# Patient Record
Sex: Male | Born: 1975 | Race: White | Hispanic: No | Marital: Married | Smoking: Current some day smoker
Health system: Southern US, Community
[De-identification: ages and names within clinical notes are randomized; demographics above are authoritative.]

## PROBLEM LIST (undated history)

## (undated) DIAGNOSIS — T7840XA Allergy, unspecified, initial encounter: Secondary | ICD-10-CM

## (undated) DIAGNOSIS — M109 Gout, unspecified: Secondary | ICD-10-CM

## (undated) HISTORY — DX: Allergy, unspecified, initial encounter: T78.40XA

## (undated) HISTORY — PX: COLONOSCOPY: SHX174

## (undated) HISTORY — DX: Gout, unspecified: M10.9

## (undated) HISTORY — PX: TYMPANOPLASTY: SHX33

---

## 2002-11-16 ENCOUNTER — Encounter: Payer: Self-pay | Admitting: Emergency Medicine

## 2002-11-16 ENCOUNTER — Emergency Department (HOSPITAL_COMMUNITY): Admission: EM | Admit: 2002-11-16 | Discharge: 2002-11-16 | Payer: Self-pay | Admitting: Emergency Medicine

## 2006-04-13 ENCOUNTER — Emergency Department (HOSPITAL_COMMUNITY): Admission: EM | Admit: 2006-04-13 | Discharge: 2006-04-14 | Payer: Self-pay | Admitting: Emergency Medicine

## 2008-04-03 IMAGING — CR DG CHEST 2V
2 series · 2 of 2 positions shown · non-contrast
Comparison: none

CLINICAL DATA: Chest pain

Chest 2 view:
No previous for comparison. Tiny dense nodule in the right midlung zone probably
granuloma. The lungs otherwise clear. Heart size and pulmonary vascularity
normal. No effusion. Visualized bones unremarkable.

[w chest pa]
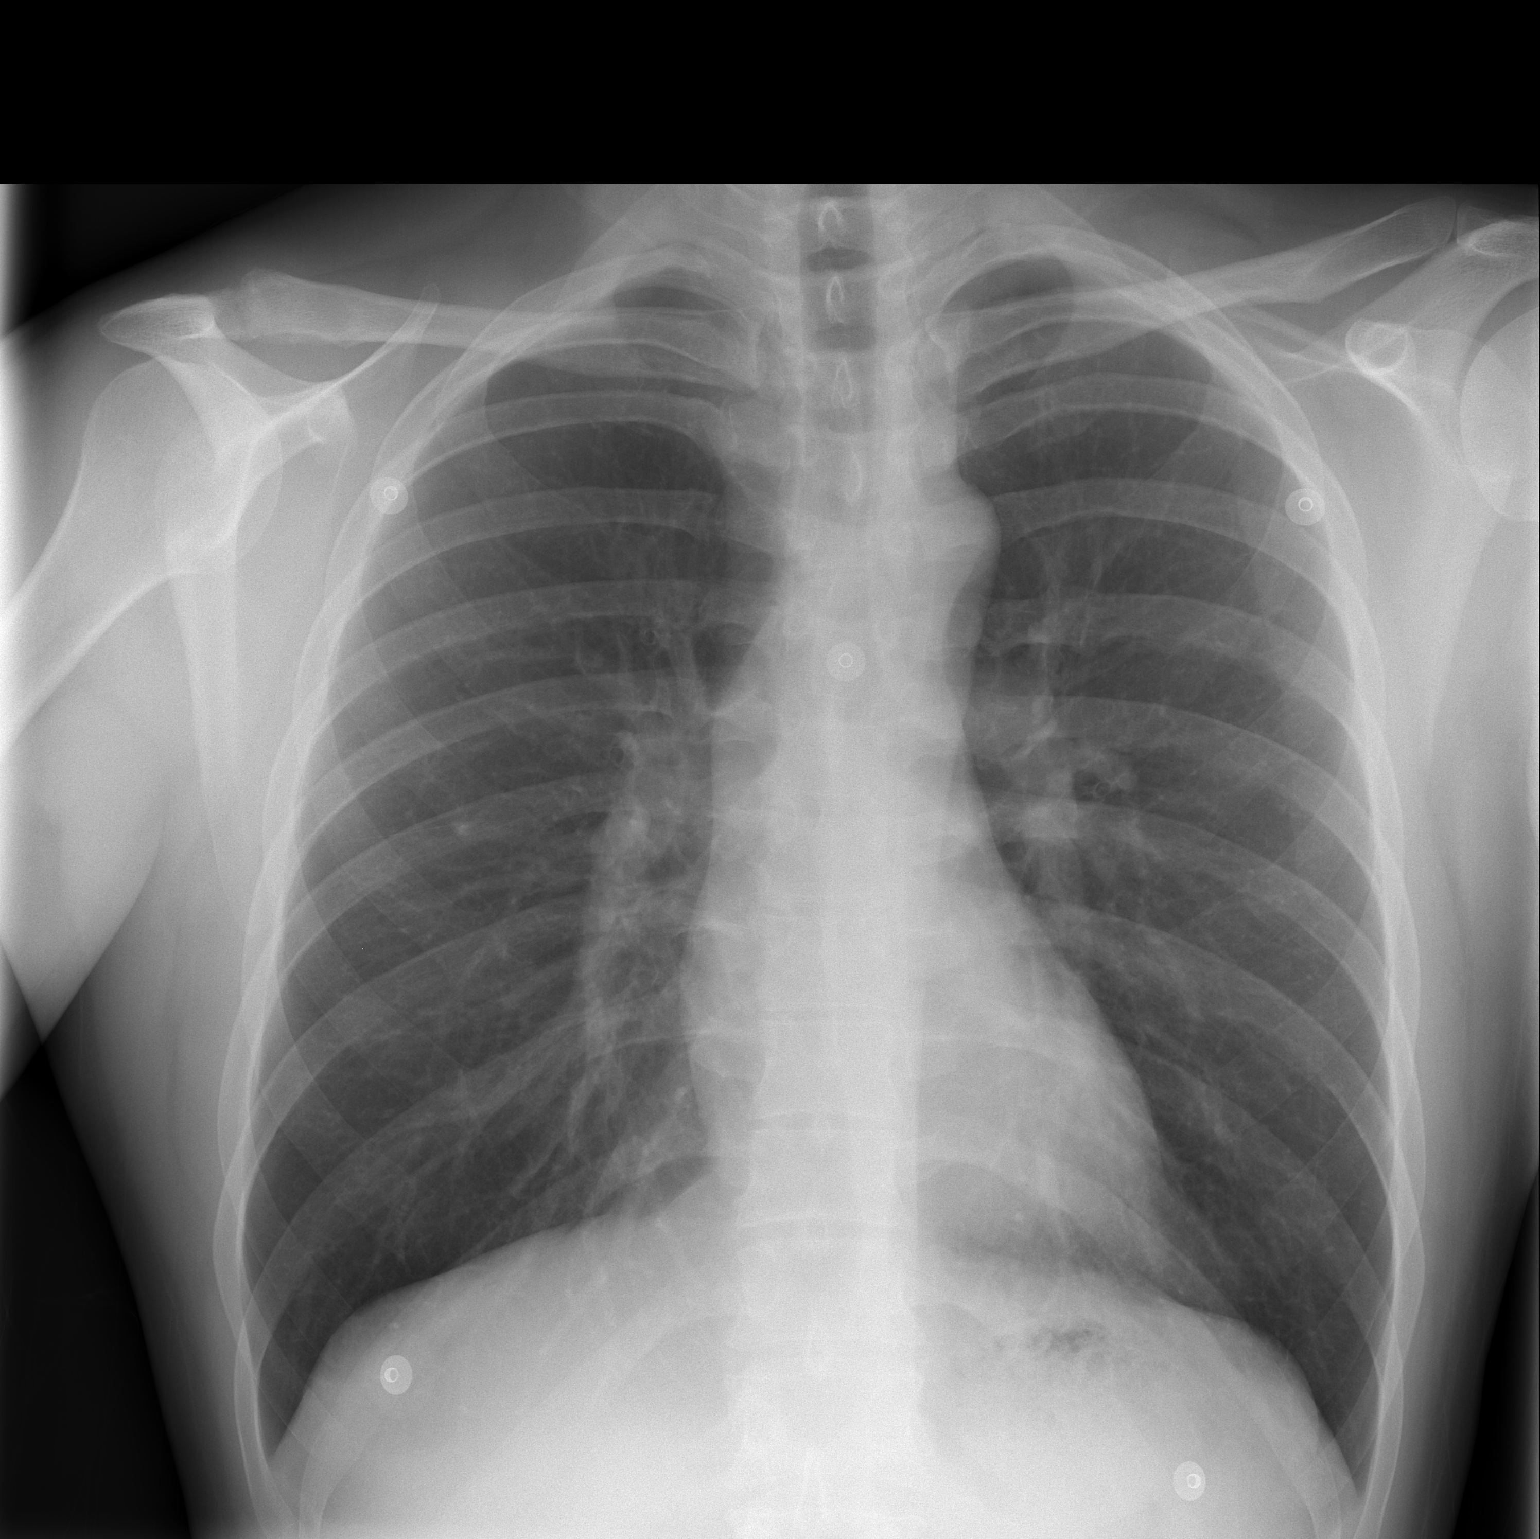

[w chest lat]
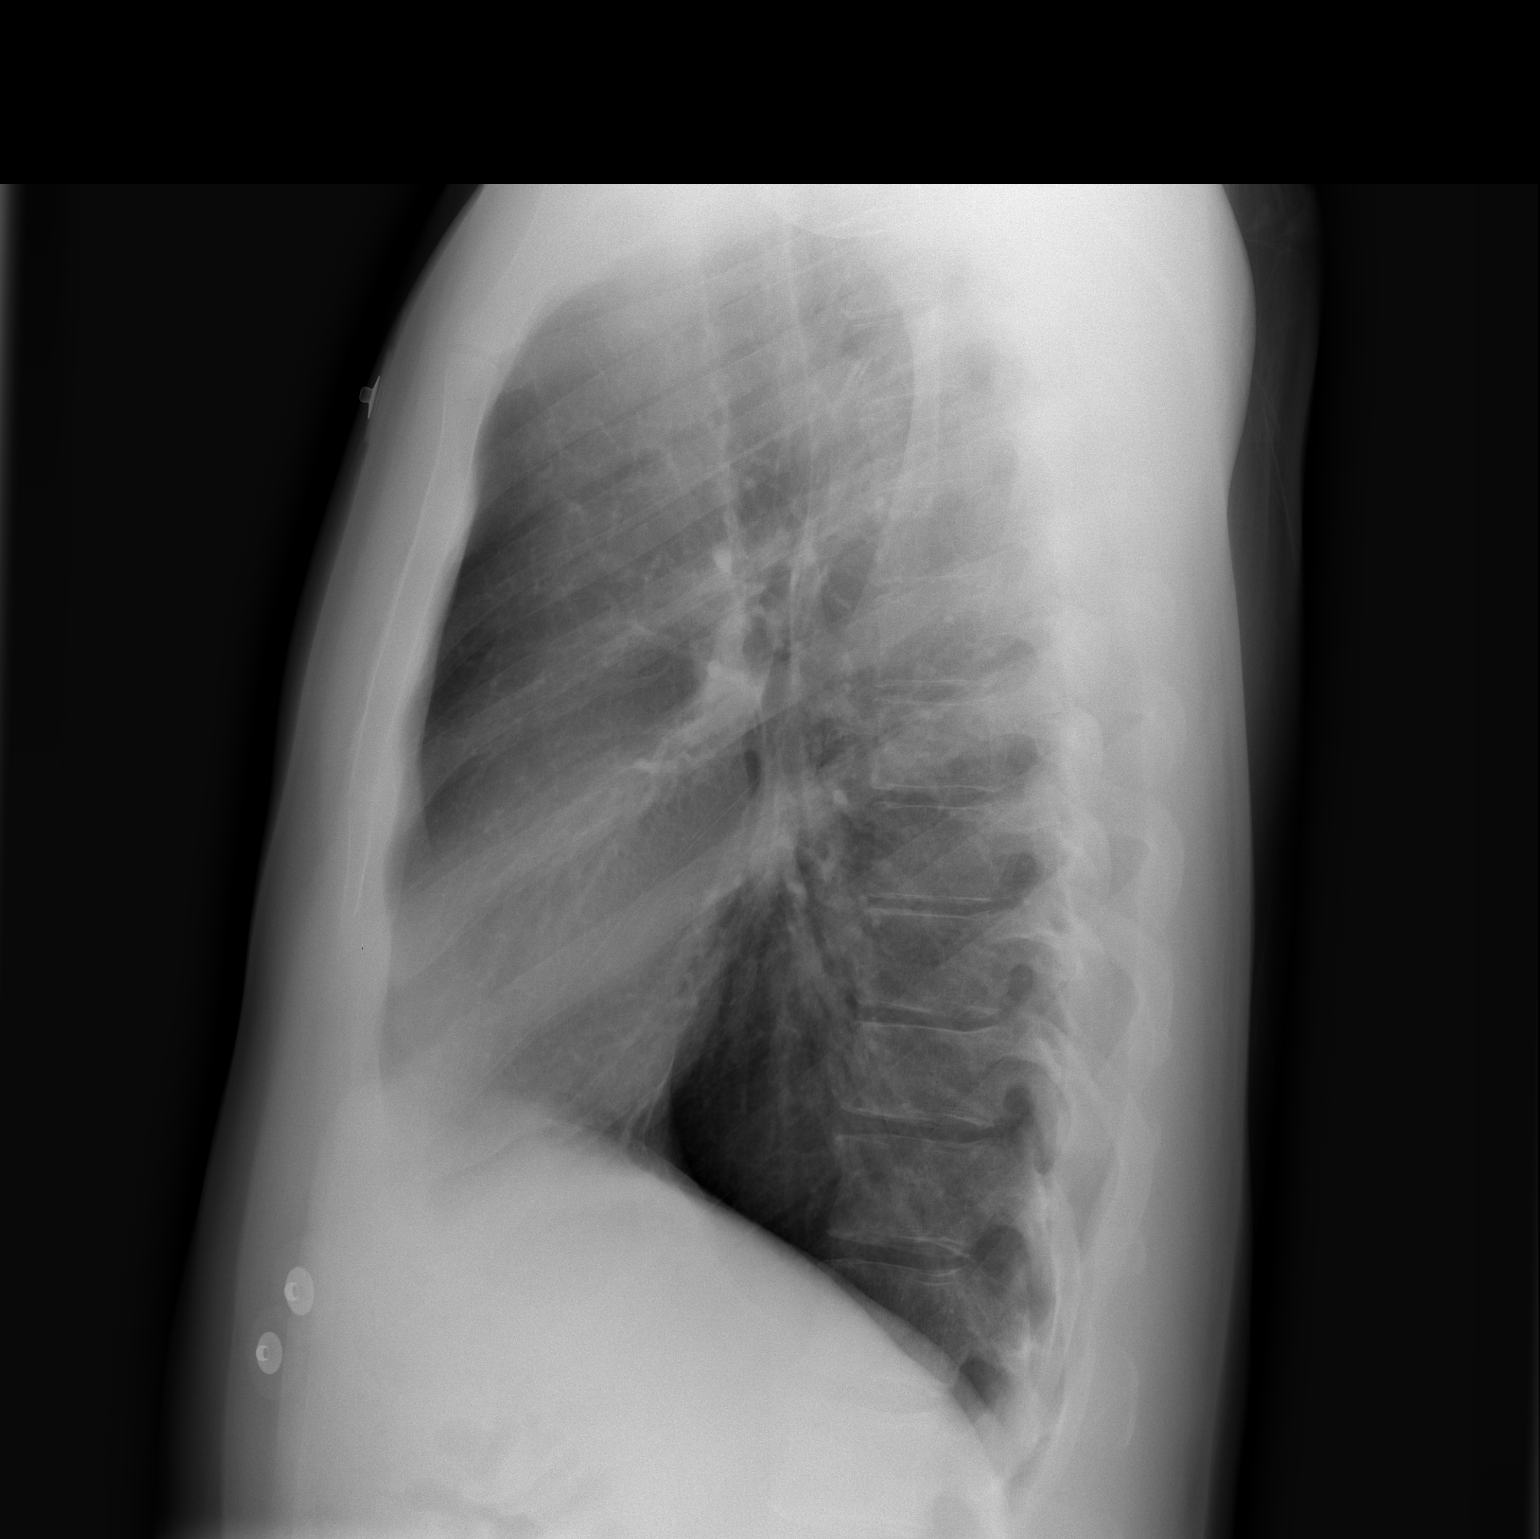

[2 of 2 positions shown; findings below may reference images not displayed]

IMPRESSION: 1. No acute disease

## 2017-10-06 ENCOUNTER — Ambulatory Visit (INDEPENDENT_AMBULATORY_CARE_PROVIDER_SITE_OTHER): Payer: BC Managed Care – PPO | Admitting: Orthopaedic Surgery

## 2017-10-06 ENCOUNTER — Ambulatory Visit (INDEPENDENT_AMBULATORY_CARE_PROVIDER_SITE_OTHER): Payer: Self-pay

## 2017-10-06 ENCOUNTER — Encounter (INDEPENDENT_AMBULATORY_CARE_PROVIDER_SITE_OTHER): Payer: Self-pay | Admitting: Orthopaedic Surgery

## 2017-10-06 VITALS — BP 137/102 | HR 82 | Resp 18 | Ht 70.0 in | Wt 215.0 lb

## 2017-10-06 DIAGNOSIS — M25562 Pain in left knee: Secondary | ICD-10-CM

## 2017-10-06 NOTE — Progress Notes (Signed)
Office Visit Note   Patient: Chad Galloway           Date of Birth: 09-09-1975           MRN: [id number] Visit Date: 10/06/2017              Requested by: No referring provider defined for this encounter. PCP: No primary care provider on file.   Assessment & Plan: Visit Diagnoses:  1. Acute pain of left knee     Plan: Several diagnostic possibilities for the left knee pain exam today is completely benign.  Might have a mild stretch of the medial collateral ligament, small tear of the medial meniscus or some early arthritis in the medial compartment not visible by plain film.  Monitor course and consider MRI scan if no improvement.  Has NSAIDs and a knee support.  Actually feeling better over the past week  Follow-Up Instructions: Return if symptoms worsen or fail to improve.   Orders:  Orders Placed This Encounter  Procedures  . XR KNEE 3 VIEW LEFT   No orders of the defined types were placed in this encounter.     Procedures: No procedures performed   Clinical Data: No additional findings.   Subjective: Chief Complaint  Patient presents with  . Left Knee - Pain  . New Patient (Initial Visit)    L MEDIAL KNEE PAIN FOR 4 WEEKS AFTER [place]. USING KNEE BRACE  42 year old gentleman experienced onset of medial left knee pain approximately a month ago while bowling.  Chad Galloway leads with his left leg as he completes the bowling motion.  The pain was somewhat insidious in onset and not particularly acute.  It has not been associated with any sensation of his knee giving way or effusion.  Over the past week he is actually been feeling somewhat better.  He wears a pullover knee support.  Difficulty with bending stooping or squatting or anterior knee pain. there is no prior history of knee injury or surgery  HPI  Review of Systems  Constitutional: Negative for fatigue and fever.  HENT: Negative for ear pain.   Eyes: Negative for pain.    Respiratory: Negative for cough and shortness of breath.   Cardiovascular: Negative for leg swelling.  Gastrointestinal: Negative for constipation and diarrhea.  Genitourinary: Negative for difficulty urinating.  Musculoskeletal: Positive for neck pain. Negative for back pain.  Skin: Negative for rash.  Neurological: Positive for weakness. Negative for numbness.  Hematological: Does not bruise/bleed easily.  Psychiatric/Behavioral: Negative for sleep disturbance.     Objective: Vital Signs: BP (!) 137/102 (BP Location: Left Arm, Patient Position: Sitting, Cuff Size: Normal)   Pulse 82   Resp 18   Ht 5\' 10"  (1.778 m)   Wt 215 lb (97.5 kg)   BMI 30.85 kg/m   Physical Exam  Constitutional: He is oriented to person, place, and time. He appears well-developed and well-nourished.  HENT:  Mouth/Throat: Oropharynx is clear and moist.  Eyes: Pupils are equal, round, and reactive to light. EOM are normal.  Pulmonary/Chest: Effort normal.  Neurological: He is alert and oriented to person, place, and time.  Skin: Skin is warm and dry.  Psychiatric: He has a normal mood and affect. His behavior is normal.    Ortho Exam awake alert and oriented x3.  Comfortable sitting.  Left knee exam without effusion.  No localized tenderness along the entire medial compartment.  No opening with varus valgus stress.  Mild patellar  crepitation but a totally asymptomatic.  No popliteal pain or mass.  No calf pain.  No distal edema.  Straight leg raise negative.  Painless range of motion left hip . skin intact.  Negative anterior drawer sign  Specialty Comments:  No specialty comments available.  Imaging: Xr Knee 3 View Left  Result Date: 10/06/2017 Films of the left knee were obtained in 3 projections standing.  There is no ectopic calcification.  No evidence of acute injury.  No evidence of osteoarthritis.  Patella tracks in the midline.  Films are benign    PMFS History: There are no active  problems to display for this patient.  History reviewed. No pertinent past medical history.  History reviewed. No pertinent family history.  Past Surgical History:  Procedure Laterality Date  . TYMPANOPLASTY     Social History   Occupational History  . Not on file  Tobacco Use  . Smoking status: Current Some Day Smoker    Types: Cigars  . Smokeless tobacco: Current User    Types: Chew  Substance and Sexual Activity  . Alcohol use: Yes  . Drug use: Not Currently  . Sexual activity: Not on file

## 2019-03-16 ENCOUNTER — Other Ambulatory Visit: Payer: Self-pay

## 2019-03-16 DIAGNOSIS — Z20822 Contact with and (suspected) exposure to covid-19: Secondary | ICD-10-CM

## 2019-03-18 LAB — NOVEL CORONAVIRUS, NAA: SARS-CoV-2, NAA: NOT DETECTED

## 2019-06-07 ENCOUNTER — Ambulatory Visit: Payer: BC Managed Care – PPO | Admitting: Family Medicine

## 2019-06-07 ENCOUNTER — Encounter: Payer: Self-pay | Admitting: Family Medicine

## 2019-06-07 ENCOUNTER — Other Ambulatory Visit: Payer: Self-pay

## 2019-06-07 VITALS — BP 132/84 | HR 94 | Temp 97.2°F | Ht 71.0 in | Wt 221.4 lb

## 2019-06-07 DIAGNOSIS — M1A0790 Idiopathic chronic gout, unspecified ankle and foot, without tophus (tophi): Secondary | ICD-10-CM | POA: Diagnosis not present

## 2019-06-07 DIAGNOSIS — H6982 Other specified disorders of Eustachian tube, left ear: Secondary | ICD-10-CM | POA: Diagnosis not present

## 2019-06-07 MED ORDER — ALLOPURINOL 100 MG PO TABS: 100.0000 mg | ORAL_TABLET | Freq: Every day | ORAL | 6 refills | Status: DC

## 2019-06-07 MED ORDER — PREDNISONE 5 MG (48) PO TBPK: ORAL_TABLET | ORAL | 0 refills | Status: DC

## 2019-06-07 NOTE — Patient Instructions (Addendum)
Give [place] 2-3 business days to get the results of your labs back.   Don't start allopurinol until your flare has passed.  Foods to AVOID: Red meat, organ meat (liver), lunch meat, seafood (mussels, scallops, anchovies, etc) Alcohol Sugary foods/beverages (diet soft drinks have no link to flares)  Foods to migrate to: Dairy Vegetables Cherries have limited data to suggest they help lower uric acid levels (and prevent flares) Vit C (500 mg daily) may have a modest effect with preventing flares Poultry If you are going to eat red meat, beef and pork may give you less problems than lamb.  For your ear, see how the prednisone does. If it doesn't do anything, let me know and we will set you up with the ENT team. If it works well, start using Flonase to help it.   Let us know if you need anything.

## 2019-06-07 NOTE — Progress Notes (Signed)
Chief Complaint  Patient presents with  . New Patient (Initial Visit)  . Gout  . Ear Pain    left       New Patient Visit SUBJECTIVE: HPI: Chad Galloway is an 44 y.o.male who is being seen for establishing care.  The patient as not previously had PCP.  3 yrs ago started having issues w gout.  It will affect both of his ankles, seemingly alternating.  There seems to be no dietary correlations.  Both his sister and father had issues with gout and ended up taking allopurinol.  He currently has a flare in his right ankle medially.  There is some swelling and redness.  Prednisone has been prescribed in the past that works well for his pain.  Patient has a several decade history of left ear fullness.  He recently had pain and was prescribed 2 rounds of antibiotics.  While there is no longer any pain, he is having fullness still.  Sometimes congestion precludes the pain.  He is not having any fevers or drainage from his ears.  He has never seen the ENT team.  Past Medical History:  Diagnosis Date  . Gout    Past Surgical History:  Procedure Laterality Date  . TYMPANOPLASTY     Family History  Problem Relation Age of Onset  . Gout Father   . Gout Sister    No Known Allergies  Current Outpatient Medications:  .  [START ON 06/20/2019] allopurinol (ZYLOPRIM) 100 MG tablet, Take 1 tablet (100 mg total) by mouth daily., Disp: 30 tablet, Rfl: 6 .  predniSONE (STERAPRED UNI-PAK 48 TAB) 5 MG (48) TBPK tablet, Follow instructions on package., Disp: 42 tablet, Rfl: 0  ROS Cardiovascular: Denies chest pain  Respiratory: Denies dyspnea   OBJECTIVE: BP 132/84 (BP Location: Left Arm, Patient Position: Sitting, Cuff Size: Large)   Pulse 94   Temp (!) 97.2 F (36.2 C) (Temporal)   Ht 5\' 11"  (1.803 m)   Wt 221 lb 6 oz (100.4 kg)   SpO2 98%   BMI 30.88 kg/m  General:  well developed, well nourished, in no apparent distress Skin:  no significant moles, warts, or growths; slight right  medial erythema and warmth with swelling; there is mild tenderness to palpation HEENT: ears neg b/l Lungs:  clear to auscultation, breath sounds equal bilaterally, no respiratory distress Cardio:  regular rate and rhythm, no LE edema or bruits Neuro: No cerebellar signs Psych: well oriented with normal range of affect and appropriate judgment/insight  ASSESSMENT/PLAN: Chronic gout of ankle, unspecified cause, unspecified laterality - Plan: Basic Metabolic Panel (BMET), Uric acid, predniSONE (STERAPRED UNI-PAK 48 TAB) 5 MG (48) TBPK tablet, allopurinol (ZYLOPRIM) 100 MG tablet  Dysfunction of left eustachian tube  1-check uric acid, given his family history and story, he will likely need to start allopurinol.  I will call this in for after his flare resolves.  Prednisone for that.  Gout diet provided. 2-this sounds like eustachian tube dysfunction.  We will see how he does on the prednisone.  If it does not affect him at all, will refer him to the ENT team.  If it gives rise for improvement, will recommend an INCS. Patient should return in 7 weeks for CPE or prn. The patient voiced understanding and agreement to the plan.   [place], DO 06/07/19  11:46 AM

## 2019-07-25 NOTE — Patient Instructions (Addendum)
It was nice to meet you today, I will be in touch with your labs as soon as possible Will adjust allopurinol if needed to lower your uric acid level Please work on gradually increasing your exercise routine (contact me if any chest pain or abnormal shortness of breath) and work on losing a few lbs over time We will set you up to see GI for a colonoscopy  Try ear drops for your left ear symptoms- let me know if this does not clear things up

## 2019-07-25 NOTE — Progress Notes (Addendum)
[facility] [address], [place], [place] [address] [phone]  Date:  07/27/2019   Name:  ARMEND BAUMHARDT   DOB:  [birth year]   MRN:  [id number]  PCP:  Darreld Mclean, MD    Chief Complaint: Annual Exam and Gout   History of Present Illness:  Chad Galloway is a 44 y.o. very pleasant male patient who presents with the following:  "Shanon Brow" is here today for short-term follow-up visit Listed as my primary care patient, though I have not actually seen him in the past He did see my partner Dr. Nani Ravens in January for concern of gout in both ankles He has about a 3 year history of gout.  It can occur in either ankle   Dr. Nani Ravens treated him for acute gout with prednisone, and had him start on allopurinol He does feel like the allopurinol is helping, although the right ankle can occasionally be painful if he holds it in a certain position  Colon cancer screening; done at age 50 due to bleeding, turned out he had a polyp. He needs to have a follow-up now due to age Flu shot up-to-date Tetanus vaccine is due Needs routine labs  He does check his BP at home on occasion; we checked his home blood pressure machine today, it seems to be inaccurate.  He will get a new one He used to smoke but quit 20-25 years ago He occasionally has a cigar His parents both have HTN  He is not doing a lot of exercise right now but he plans to work on this   BP Readings from Last 3 Encounters:  07/27/19 128/82  06/07/19 132/84  10/06/17 (!) 137/102   Married to [employer], they have 2 sons ages 61 and 29 His oldest has autism but is fairly high functioning. He is doing well at a special school, and his youngest is at [employer].    Pt's mother is a retired Pharmacist, hospital and has helped out during the pandemic Pt is an Optometrist   No family history of prostate cancer  Patient notes a lot of problems with his left ear as a child, he had  tubes at least once.  More recently he notes that the ear feels sometimes tender and uncomfortable, he seems to have foul-smelling earwax from the left ear  Patient Active Problem List   Diagnosis Date Noted  . 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 Gout     Past Surgical History:  Procedure Laterality Date  . TYMPANOPLASTY      Social History   Tobacco Use  . Smoking status: Current Some Day Smoker    Types: Cigars  . Smokeless tobacco: Current User    Types: Chew  Substance Use Topics  . Alcohol use: Yes  . Drug use: Not Currently    Family History  Problem Relation Age of Onset  . Gout Father   . Gout Sister     No Known Allergies  Medication list has been reviewed and updated.  Current Outpatient Medications on File Prior to Visit  Medication Sig Dispense Refill  . allopurinol (ZYLOPRIM) 100 MG tablet Take 1 tablet (100 mg total) by mouth daily. 30 tablet 6  . augmented betamethasone dipropionate (DIPROLENE-AF) 0.05 % ointment Apply 1 application topically 2 (two) times daily as needed.    Marland Kitchen ketoconazole (NIZORAL) 2 % shampoo  Apply 1 application topically 3 (three) times a week.    . mometasone (ELOCON) 0.1 % lotion Apply 1 application topically daily as needed.     No current facility-administered medications on file prior to visit.    Review of Systems:  As per HPI- otherwise negative.   Physical Examination: Vitals:   07/27/19 1020  BP: 128/82  Pulse: 92  Resp: 16  Temp: (!) 96.6 F (35.9 C)  SpO2: 98%   Vitals:   07/27/19 1020  Weight: 219 lb (99.3 kg)  Height: 5\' 11"  (1.803 m)   Body mass index is 30.54 kg/m. Ideal Body Weight: Weight in (lb) to have BMI = 25: 178.9  GEN: no acute distress.  Overweight, otherwise looks well HEENT: Atraumatic, Normocephalic.   Bilateral TM wnl, oropharynx normal.  PEERL,EOMI. the left external ear canal is somewhat damp, there is some debris adherent to the walls  of the canal Ears and Nose: No external deformity. CV: RRR, No M/G/R. No JVD. No thrill. No extra heart sounds. PULM: CTA B, no wheezes, crackles, rhonchi. No retractions. No resp. distress. No accessory muscle use. ABD: S, NT, ND, +BS. No rebound. No HSM. EXTR: No c/c/e PSYCH: Normally interactive. Conversant.    Assessment and Plan: Chronic gout of ankle, unspecified cause, unspecified laterality - Plan: CBC, Comprehensive metabolic panel, Uric acid  Screening for deficiency anemia - Plan: CBC  Screening for diabetes mellitus - Plan: Comprehensive metabolic panel, Hemoglobin A1c  Screening for hyperlipidemia - Plan: Lipid panel  Immunization due - Plan: Tdap vaccine greater than or equal to 7yo IM  Other infective acute otitis externa of left ear - Plan: ofloxacin (FLOXIN OTIC) 0.3 % OTIC solution  Screening for colon cancer - Plan: Ambulatory referral to Gastroenterology  Routine labs pending as above Refer to GI for colonoscopy Updated tetanus Will try 7 to 10 days of Floxin Otic for his left ear, he will let me know if this is not helpful-in that case plan referral to ENT Moderate medical decision making today This visit occurred during the SARS-CoV-2 public health emergency.  Safety protocols were in place, including screening questions prior to the visit, additional usage of staff PPE, and extensive cleaning of exam room while observing appropriate contact time as indicated for disinfecting solutions.    Signed Lamar Blinks, MD  Received his labs as below, message to patient  Results for orders placed or performed in visit on 07/27/19  CBC  Result Value Ref Range   WBC 5.6 4.0 - 10.5 K/uL   RBC 4.76 4.22 - 5.81 Mil/uL   Platelets 237.0 150.0 - 400.0 K/uL   Hemoglobin 15.3 13.0 - 17.0 g/dL   HCT 44.9 39.0 - 52.0 %   MCV 94.5 78.0 - 100.0 fl   MCHC 34.1 30.0 - 36.0 g/dL   RDW 13.0 11.5 - 15.5 %  Comprehensive metabolic panel  Result Value Ref Range   Sodium  140 135 - 145 mEq/L   Potassium 3.9 3.5 - 5.1 mEq/L   Chloride 104 96 - 112 mEq/L   CO2 29 19 - 32 mEq/L   Glucose, Bld 97 70 - 99 mg/dL   BUN 13 6 - 23 mg/dL   Creatinine, Ser 1.06 0.40 - 1.50 mg/dL   Total Bilirubin 0.7 0.2 - 1.2 mg/dL   Alkaline Phosphatase 76 39 - 117 U/L   AST 18 0 - 37 U/L   ALT 26 0 - 53 U/L   Total Protein 6.8 6.0 - 8.3  g/dL   Albumin 4.4 3.5 - 5.2 g/dL   GFR 75.31 >60.00 mL/min   Calcium 9.5 8.4 - 10.5 mg/dL  Hemoglobin A1c  Result Value Ref Range   Hgb A1c MFr Bld 4.7 4.6 - 6.5 %  Lipid panel  Result Value Ref Range   Cholesterol 207 (H) 0 - 200 mg/dL   Triglycerides 160.0 (H) 0.0 - 149.0 mg/dL   HDL 43.00 >39.00 mg/dL   VLDL 32.0 0.0 - 40.0 mg/dL   LDL Cholesterol 132 (H) 0 - 99 mg/dL   Total CHOL/HDL Ratio 5    NonHDL 164.09   Uric acid  Result Value Ref Range   Uric Acid, Serum 7.3 4.0 - 7.8 mg/dL

## 2019-07-26 ENCOUNTER — Other Ambulatory Visit: Payer: Self-pay

## 2019-07-27 ENCOUNTER — Encounter: Payer: Self-pay | Admitting: Family Medicine

## 2019-07-27 ENCOUNTER — Other Ambulatory Visit: Payer: Self-pay

## 2019-07-27 ENCOUNTER — Ambulatory Visit (INDEPENDENT_AMBULATORY_CARE_PROVIDER_SITE_OTHER): Payer: BC Managed Care – PPO | Admitting: Family Medicine

## 2019-07-27 VITALS — BP 128/82 | HR 92 | Temp 96.6°F | Resp 16 | Ht 71.0 in | Wt 219.0 lb

## 2019-07-27 DIAGNOSIS — Z13220 Encounter for screening for lipoid disorders: Secondary | ICD-10-CM

## 2019-07-27 DIAGNOSIS — Z1211 Encounter for screening for malignant neoplasm of colon: Secondary | ICD-10-CM

## 2019-07-27 DIAGNOSIS — Z23 Encounter for immunization: Secondary | ICD-10-CM

## 2019-07-27 DIAGNOSIS — Z130 Encounter for screening for diseases of the blood and blood-forming organs and certain disorders involving the immune mechanism: Secondary | ICD-10-CM | POA: Diagnosis not present

## 2019-07-27 DIAGNOSIS — M1A0790 Idiopathic chronic gout, unspecified ankle and foot, without tophus (tophi): Secondary | ICD-10-CM | POA: Diagnosis not present

## 2019-07-27 DIAGNOSIS — Z131 Encounter for screening for diabetes mellitus: Secondary | ICD-10-CM

## 2019-07-27 DIAGNOSIS — H60392 Other infective otitis externa, left ear: Secondary | ICD-10-CM

## 2019-07-27 LAB — COMPREHENSIVE METABOLIC PANEL
ALT: 26 U/L (ref 0–53)
AST: 18 U/L (ref 0–37)
Albumin: 4.4 g/dL (ref 3.5–5.2)
Alkaline Phosphatase: 76 U/L (ref 39–117)
BUN: 13 mg/dL (ref 6–23)
CO2: 29 mEq/L (ref 19–32)
Calcium: 9.5 mg/dL (ref 8.4–10.5)
Chloride: 104 mEq/L (ref 96–112)
Creatinine, Ser: 1.06 mg/dL (ref 0.40–1.50)
GFR: 75.31 mL/min (ref >60.00)
Glucose, Bld: 97 mg/dL (ref 70–99)
Potassium: 3.9 mEq/L (ref 3.5–5.1)
Sodium: 140 mEq/L (ref 135–145)
Total Bilirubin: 0.7 mg/dL (ref 0.2–1.2)
Total Protein: 6.8 g/dL (ref 6.0–8.3)

## 2019-07-27 LAB — CBC
HCT: 44.9 % (ref 39.0–52.0)
Hemoglobin: 15.3 g/dL (ref 13.0–17.0)
MCHC: 34.1 g/dL (ref 30.0–36.0)
MCV: 94.5 fl (ref 78.0–100.0)
Platelets: 237 10*3/uL (ref 150.0–400.0)
RBC: 4.76 Mil/uL (ref 4.22–5.81)
RDW: 13 % (ref 11.5–15.5)
WBC: 5.6 10*3/uL (ref 4.0–10.5)

## 2019-07-27 LAB — HEMOGLOBIN A1C: Hgb A1c MFr Bld: 4.7 % (ref 4.6–6.5)

## 2019-07-27 LAB — LIPID PANEL
Cholesterol: 207 mg/dL — ABNORMAL HIGH (ref 0–200)
HDL: 43 mg/dL (ref >39.00)
LDL Cholesterol: 132 mg/dL — ABNORMAL HIGH (ref 0–99)
NonHDL: 164.09
Total CHOL/HDL Ratio: 5
Triglycerides: 160 mg/dL — ABNORMAL HIGH (ref 0.0–149.0)
VLDL: 32 mg/dL (ref 0.0–40.0)

## 2019-07-27 LAB — URIC ACID: Uric Acid, Serum: 7.3 mg/dL (ref 4.0–7.8)

## 2019-07-27 MED ORDER — OFLOXACIN 0.3 % OT SOLN: 10.0000 [drp] | Freq: Every day | OTIC | 0 refills | Status: DC

## 2019-07-27 MED ORDER — ALLOPURINOL 100 MG PO TABS: 200.0000 mg | ORAL_TABLET | Freq: Every day | ORAL | 3 refills | Status: DC

## 2019-07-27 NOTE — Addendum Note (Signed)
Addended by: Lamar Blinks C on: 07/27/2019 06:16 PM   Modules accepted: Orders

## 2019-08-04 ENCOUNTER — Encounter: Payer: Self-pay | Admitting: Gastroenterology

## 2019-08-04 ENCOUNTER — Encounter: Payer: Self-pay | Admitting: Family Medicine

## 2019-08-18 ENCOUNTER — Ambulatory Visit: Payer: BC Managed Care – PPO | Attending: Internal Medicine

## 2019-08-18 DIAGNOSIS — Z23 Encounter for immunization: Secondary | ICD-10-CM

## 2019-08-18 NOTE — Progress Notes (Signed)
   Covid-19 Vaccination Clinic  Name:  Chad Galloway    MRN: [id number] DOB: 07-12-1975  08/18/2019  Mr. Chad Galloway was observed post Covid-19 immunization for 15 minutes without incident. He was provided with Vaccine Information Sheet and instruction to access the V-Safe system.   Mr. Chad Galloway was instructed to call 911 with any severe reactions post vaccine: Marland Kitchen Difficulty breathing  . Swelling of face and throat  . A fast heartbeat  . A bad rash all over body  . Dizziness and weakness   Immunizations Administered    Name Date Dose VIS Date Route   Pfizer COVID-19 Vaccine 08/18/2019 10:50 AM 0.3 mL 05/06/2019 Intramuscular   Manufacturer: [place]   Lot: [id number]   [place]: [id number]

## 2019-09-09 ENCOUNTER — Other Ambulatory Visit: Payer: Self-pay

## 2019-09-09 ENCOUNTER — Ambulatory Visit (AMBULATORY_SURGERY_CENTER): Payer: Self-pay | Admitting: *Deleted

## 2019-09-09 VITALS — Temp 97.1°F | Ht 71.0 in | Wt 220.0 lb

## 2019-09-09 DIAGNOSIS — Z1211 Encounter for screening for malignant neoplasm of colon: Secondary | ICD-10-CM

## 2019-09-09 NOTE — Progress Notes (Signed)
Patient is here in-person for PV. Patient denies any allergies to eggs or soy. Patient denies any problems with anesthesia/sedation. Patient denies any oxygen use at home. Patient denies taking any diet/weight loss medications or blood thinners. Patient is not being treated for MRSA or C-diff. EMMI education assisgned to the patient for the procedure, this was explained and instructions given to patient. Patient is aware of our care-partner policy and 0000000 safety protocol.   covid vaccines completed per pt.

## 2019-09-12 ENCOUNTER — Ambulatory Visit: Payer: BC Managed Care – PPO | Attending: Internal Medicine

## 2019-09-12 DIAGNOSIS — Z23 Encounter for immunization: Secondary | ICD-10-CM

## 2019-09-12 NOTE — Progress Notes (Signed)
   Covid-19 Vaccination Clinic  Name:  Chad Galloway    MRN: [id number] DOB: 04-05-1976  09/12/2019  Mr. [name] was observed post Covid-19 immunization for 15 minutes without incident. He was provided with Vaccine Information Sheet and instruction to access the V-Safe system.   Mr. [name] was instructed to call 911 with any severe reactions post vaccine: Marland Kitchen Difficulty breathing  . Swelling of face and throat  . A fast heartbeat  . A bad rash all over body  . Dizziness and weakness   Immunizations Administered    Name Date Dose VIS Date Route   Pfizer COVID-19 Vaccine 09/12/2019 10:50 AM 0.3 mL 07/20/2018 Intramuscular   Manufacturer: [place]   Lot: [id number]   [place]: [id number]

## 2019-09-21 ENCOUNTER — Encounter: Payer: Self-pay | Admitting: Gastroenterology

## 2019-09-23 ENCOUNTER — Ambulatory Visit (AMBULATORY_SURGERY_CENTER): Payer: BC Managed Care – PPO | Admitting: Gastroenterology

## 2019-09-23 ENCOUNTER — Encounter: Payer: Self-pay | Admitting: Gastroenterology

## 2019-09-23 ENCOUNTER — Other Ambulatory Visit: Payer: Self-pay

## 2019-09-23 VITALS — BP 128/94 | HR 74 | Temp 96.9°F | Resp 12 | Ht 71.0 in | Wt 220.0 lb

## 2019-09-23 DIAGNOSIS — D128 Benign neoplasm of rectum: Secondary | ICD-10-CM

## 2019-09-23 DIAGNOSIS — D124 Benign neoplasm of descending colon: Secondary | ICD-10-CM

## 2019-09-23 DIAGNOSIS — Z1211 Encounter for screening for malignant neoplasm of colon: Secondary | ICD-10-CM | POA: Diagnosis present

## 2019-09-23 MED ORDER — SODIUM CHLORIDE 0.9 % IV SOLN: 500.0000 mL | Freq: Once | INTRAVENOUS | Status: DC

## 2019-09-23 NOTE — Op Note (Signed)
[facility] Patient Name: Chad Galloway Procedure Date: 09/23/2019 8:36 AM MRN: [id number] Endoscopist: Mallie Mussel L. Loletha Carrow , MD Age: 44 Referring MD:  Date of Birth: 18-Jun-1975 Gender: Male Account #: [id number] Procedure:                Colonoscopy Indications:              Screening for colorectal malignant neoplasm                            (patient reports colonoscopy 15-18 years ago for                            rectal bleeding) Medicines:                Monitored Anesthesia Care Procedure:                Pre-Anesthesia Assessment:                           - Prior to the procedure, a History and Physical                            was performed, and patient medications and                            allergies were reviewed. The patient's tolerance of                            previous anesthesia was also reviewed. The risks                            and benefits of the procedure and the sedation                            options and risks were discussed with the patient.                            All questions were answered, and informed consent                            was obtained. Prior Anticoagulants: The patient has                            taken no previous anticoagulant or antiplatelet                            agents. ASA Grade Assessment: II - A patient with                            mild systemic disease. After reviewing the risks                            and benefits, the patient was deemed in  satisfactory condition to undergo the procedure.                           After obtaining informed consent, the colonoscope                            was passed under direct vision. Throughout the                            procedure, the patient's blood pressure, pulse, and                            oxygen saturations were monitored continuously. The                            Colonoscope was introduced through the anus and                            advanced to the the terminal ileum, with                            identification of the appendiceal orifice and IC                            valve. The colonoscopy was performed without                            difficulty. The patient tolerated the procedure                            well. The quality of the bowel preparation was                            good. The ileocecal valve, appendiceal orifice, and                            rectum were photographed. The bowel preparation                            used was Miralax. Scope In: 8:41:35 AM Scope Out: 8:57:18 AM Scope Withdrawal Time: 0 hours 14 minutes 26 seconds  Total Procedure Duration: 0 hours 15 minutes 43 seconds  Findings:                 The perianal and digital rectal examinations were                            normal.                           The terminal ileum appeared normal.                           A diminutive polyp was found in the descending  colon. The polyp was sessile. The polyp was removed                            with a cold snare. Resection and retrieval were                            complete.                           A 5 mm polyp was found in the rectum. The polyp was                            semi-pedunculated. The polyp was removed with a hot                            snare. Resection and retrieval were complete.                           The exam was otherwise without abnormality on                            direct and retroflexion views. Complications:            No immediate complications. Estimated Blood Loss:     Estimated blood loss was minimal. Impression:               - The examined portion of the ileum was normal.                           - One diminutive polyp in the descending colon,                            removed with a cold snare. Resected and retrieved.                           - One 5 mm polyp in the rectum, removed  with a hot                            snare. Resected and retrieved.                           - The examination was otherwise normal on direct                            and retroflexion views. Recommendation:           - Patient has a contact number available for                            emergencies. The signs and symptoms of potential                            delayed complications were discussed with the  patient. Return to normal activities tomorrow.                            Written discharge instructions were provided to the                            patient.                           - Resume previous diet.                           - Continue present medications.                           - Await pathology results.                           - Repeat colonoscopy is recommended for                            surveillance. The colonoscopy date will be                            determined after pathology results from today's                            exam become available for review. Gamaliel Charney L. Loletha Carrow, MD 09/23/2019 9:02:47 AM This report has been signed electronically.

## 2019-09-23 NOTE — Progress Notes (Signed)
Called to room to assist during endoscopic procedure.  Patient ID and intended procedure confirmed with present staff. Received instructions for my participation in the procedure from the performing physician.  

## 2019-09-23 NOTE — Progress Notes (Signed)
Vitals-CW Temp-LC  Pt's states no medical or surgical changes since previsit or office visit.  

## 2019-09-23 NOTE — Progress Notes (Signed)
Report to PACU, RN, vss, BBS= Clear.  

## 2019-09-23 NOTE — Patient Instructions (Signed)
Handout on polyps given to you today  Await pathology results   YOU HAD AN ENDOSCOPIC PROCEDURE TODAY AT THE  ENDOSCOPY CENTER:   Refer to the procedure report that was given to you for any specific questions about what was found during the examination.  If the procedure report does not answer your questions, please call your gastroenterologist to clarify.  If you requested that your care partner not be given the details of your procedure findings, then the procedure report has been included in a sealed envelope for you to review at your convenience later.  YOU SHOULD EXPECT: Some feelings of bloating in the abdomen. Passage of more gas than usual.  Walking can help get rid of the air that was put into your GI tract during the procedure and reduce the bloating. If you had a lower endoscopy (such as a colonoscopy or flexible sigmoidoscopy) you may notice spotting of blood in your stool or on the toilet paper. If you underwent a bowel prep for your procedure, you may not have a normal bowel movement for a few days.  Please Note:  You might notice some irritation and congestion in your nose or some drainage.  This is from the oxygen used during your procedure.  There is no need for concern and it should clear up in a day or so.  SYMPTOMS TO REPORT IMMEDIATELY:   Following lower endoscopy (colonoscopy or flexible sigmoidoscopy):  Excessive amounts of blood in the stool  Significant tenderness or worsening of abdominal pains  Swelling of the abdomen that is new, acute  Fever of 100F or higher  For urgent or emergent issues, a gastroenterologist can be reached at any hour by calling [phone]. Do not use MyChart messaging for urgent concerns.    DIET:  We do recommend a small meal at first, but then you may proceed to your regular diet.  Drink plenty of fluids but you should avoid alcoholic beverages for 24 hours.  ACTIVITY:  You should plan to take it easy for the rest of today and  you should NOT DRIVE or use heavy machinery until tomorrow (because of the sedation medicines used during the test).    FOLLOW UP: Our staff will call the number listed on your records 48-72 hours following your procedure to check on you and address any questions or concerns that you may have regarding the information given to you following your procedure. If we do not reach you, we will leave a message.  We will attempt to reach you two times.  During this call, we will ask if you have developed any symptoms of COVID 19. If you develop any symptoms (ie: fever, flu-like symptoms, shortness of breath, cough etc.) before then, please call [phone].  If you test positive for Covid 19 in the 2 weeks post procedure, please call and report this information to us.    If any biopsies were taken you will be contacted by phone or by letter within the next 1-3 weeks.  Please call us at [phone] if you have not heard about the biopsies in 3 weeks.    SIGNATURES/CONFIDENTIALITY: You and/or your care partner have signed paperwork which will be entered into your electronic medical record.  These signatures attest to the fact that that the information above on your After Visit Summary has been reviewed and is understood.  Full responsibility of the confidentiality of this discharge information lies with you and/or your care-partner. 

## 2019-09-27 ENCOUNTER — Telehealth: Payer: Self-pay

## 2019-09-27 ENCOUNTER — Encounter: Payer: Self-pay | Admitting: Gastroenterology

## 2019-09-27 NOTE — Telephone Encounter (Signed)
LVM

## 2019-09-27 NOTE — Telephone Encounter (Signed)
  Follow up Call-  Call back number 09/23/2019  Post procedure Call Back phone  # [phone]  Permission to leave phone message Yes  Some recent data might be hidden     Left message

## 2020-02-13 ENCOUNTER — Telehealth: Payer: BC Managed Care – PPO | Admitting: Family Medicine

## 2020-02-13 ENCOUNTER — Other Ambulatory Visit: Payer: Self-pay

## 2020-02-13 ENCOUNTER — Telehealth (INDEPENDENT_AMBULATORY_CARE_PROVIDER_SITE_OTHER): Payer: BC Managed Care – PPO | Admitting: Family Medicine

## 2020-02-13 DIAGNOSIS — M1A0790 Idiopathic chronic gout, unspecified ankle and foot, without tophus (tophi): Secondary | ICD-10-CM

## 2020-02-13 MED ORDER — PREDNISONE 5 MG (48) PO TBPK: ORAL_TABLET | ORAL | 0 refills | Status: DC

## 2020-02-13 NOTE — Progress Notes (Signed)
[place] at [facility] [address], [place], [place] [address] [phone]  Date:  02/13/2020   Name:  Chad Galloway   DOB:  01-04-1976   MRN:  [id number]  PCP:  Shelda Pal, DO    Chief Complaint: No chief complaint on file.   History of Present Illness:  Chad Galloway is a 44 y.o. very pleasant male patient who presents with the following:  Primary patient of my partner Dr. Nani Ravens, history of gout Virtual visit today -  I saw patient most recently for gout in March of this year He is treated with allopurinol, we have use steroids off and on as needed for gout flare He has done well until this past weekend- gout sx have been slowly increasing Sx are in his left ankle like normally  It is not hot but is a bit puffy Seems to him like a typical gout flare NKI to explain his sx  No fever or chills   He is feeling well except for his ankle  He plans to see me in the next week or so for labs and routine recheck anyway    Patient Active Problem List   Diagnosis Date Noted  . 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 Allergy   . Gout     Past Surgical History:  Procedure Laterality Date  . COLONOSCOPY  + 15 years ago    At [facility]="benign polyp" per pt.  . TYMPANOPLASTY  as child   post op nausea and vomiting    Social History   Tobacco Use  . Smoking status: Current Some Day Smoker    Types: Cigars  . Smokeless tobacco: Never Used  Vaping Use  . Vaping Use: Never used  Substance Use Topics  . Alcohol use: Yes    Alcohol/week: 6.0 standard drinks    Types: 6 Cans of beer per week  . Drug use: Not Currently    Family History  Problem Relation Age of Onset  . Gout Father   . Colon polyps Father   . Gout Sister   . Colon cancer Neg Hx   . Esophageal cancer Neg Hx   . Rectal cancer Neg Hx   . Stomach cancer Neg Hx     No Known  Allergies  Medication list has been reviewed and updated.  Current Outpatient Medications on File Prior to Visit  Medication Sig Dispense Refill  . allopurinol (ZYLOPRIM) 100 MG tablet Take 2 tablets (200 mg total) by mouth daily. 180 tablet 3  . augmented betamethasone dipropionate (DIPROLENE-AF) 0.05 % ointment Apply 1 application topically 2 (two) times daily as needed.    Marland Kitchen ketoconazole (NIZORAL) 2 % shampoo Apply 1 application topically 3 (three) times a week.    . mometasone (ELOCON) 0.1 % lotion Apply 1 application topically daily as needed.    Marland Kitchen ofloxacin (FLOXIN OTIC) 0.3 % OTIC solution Place 10 drops into the left ear daily. Use for 7- 10 days 5 mL 0   No current facility-administered medications on file prior to visit.    Review of Systems:  As per HPI- otherwise negative.   Physical Examination: There were no vitals filed for this visit. There were no vitals filed for this visit. There is no height or weight on file to calculate BMI. Ideal Body Weight:    Connected with pt via video monitor-.  He is able to see me but I cannot view patient Patient sounds well, no cough, wheezing, distress is noted He is not checking his BP at home right now but has generally been ok   Assessment and Plan: Chronic gout of ankle, unspecified cause, unspecified laterality - Plan: predniSONE (STERAPRED UNI-PAK 48 TAB) 5 MG (48) TBPK tablet  Visit today for recurrent gout.  Virtual visit, discussed via speaker phone Patient has history of recurrent gout in his ankle, he is currently experiencing typical gout symptoms.  He is responded well to a prednisone pack as above previously, refilled this for him He does admit to smoking some pork at home recently, this could've triggered his gout attack He will let me know if not feeling better in the next couple of days as he typically would, otherwise remain appointment for next week for routine visit in the office  Spoke with patient for about  10 minutes today  Signed Lamar Blinks, MD

## 2020-02-17 NOTE — Progress Notes (Addendum)
[place] at [facility] [address] [place], [place] [address] [phone]  Date:  02/20/2020   Name:  Chad Galloway   DOB:  May 29, 1975   MRN:  [id number]  PCP:  Shelda Pal, DO    Chief Complaint: Lab Work and Flu Vaccine   History of Present Illness:  Chad Galloway is a 44 y.o. very pleasant male patient who presents with the following:  Pt here today for a recheck- pt of Dr Nani Ravens History of gout Last seen by myself for a video visit just recently for gout- he is taking allopurinol.  Gave prednisone pack for recent flare - this did work and relieved his pain almost immediately, he is still taking it  Uric acid elevated in March of this year  Check uric acid and lipids today Hep C, hiv screening Flu vaccine- defer as on steroids covid series UTD- he plans to get a booster at 6 months mark  We discussed lipids today.  Patient will potentially be interested in starting a statin medication for pain on his upcoming numbers.  He does also want to work on weight loss and exercise     Patient Active Problem List   Diagnosis Date Noted  . 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 Allergy   . Gout     Past Surgical History:  Procedure Laterality Date  . COLONOSCOPY  + 15 years ago    At [facility]="benign polyp" per pt.  . TYMPANOPLASTY  as child   post op nausea and vomiting    Social History   Tobacco Use  . Smoking status: Current Some Day Smoker    Types: Cigars  . Smokeless tobacco: Never Used  Vaping Use  . Vaping Use: Never used  Substance Use Topics  . Alcohol use: Yes    Alcohol/week: 6.0 standard drinks    Types: 6 Cans of beer per week  . Drug use: Not Currently    Family History  Problem Relation Age of Onset  . Gout Father   . Colon polyps Father   . Gout Sister   . Colon cancer Neg Hx   . Esophageal cancer Neg Hx   . Rectal cancer Neg  Hx   . Stomach cancer Neg Hx     No Known Allergies  Medication list has been reviewed and updated.  Current Outpatient Medications on File Prior to Visit  Medication Sig Dispense Refill  . allopurinol (ZYLOPRIM) 100 MG tablet Take 2 tablets (200 mg total) by mouth daily. 180 tablet 3  . augmented betamethasone dipropionate (DIPROLENE-AF) 0.05 % ointment Apply 1 application topically 2 (two) times daily as needed.    Marland Kitchen ketoconazole (NIZORAL) 2 % shampoo Apply 1 application topically 3 (three) times a week.    . mometasone (ELOCON) 0.1 % lotion Apply 1 application topically daily as needed.    . predniSONE (STERAPRED UNI-PAK 48 TAB) 5 MG (48) TBPK tablet Take 5 mg by mouth daily.     No current facility-administered medications on file prior to visit.    Review of Systems:  As per HPI- otherwise negative.   Physical Examination: Vitals:   02/20/20 1109  BP: 136/88  Pulse: 76  Resp: 16  SpO2: 98%   Vitals:   02/20/20 1109  Weight: 217 lb (98.4 kg)  Height: 5\' 11"  (1.803 m)   Body mass index is  30.27 kg/m. Ideal Body Weight: Weight in (lb) to have BMI = 25: 178.9  GEN: no acute distress.  Overweight, looks well HEENT: Atraumatic, Normocephalic.  Ears and Nose: No external deformity. CV: RRR, No M/G/R. No JVD. No thrill. No extra heart sounds. PULM: CTA B, no wheezes, crackles, rhonchi. No retractions. No resp. distress. No accessory muscle use. EXTR: No c/c/e PSYCH: Normally interactive. Conversant.    Assessment and Plan: Chronic gout of ankle, unspecified cause, unspecified laterality - Plan: Uric acid  Screening for hyperlipidemia - Plan: Lipid panel  Screening for HIV (human immunodeficiency virus) - Plan: HIV Antibody (routine testing w rflx)  Encounter for hepatitis C screening test for low risk patient - Plan: Hepatitis C antibody  Patient today to recheck on gout.  He has had fairly good results allopurinol, does have recurrent flare recently.  We will  recheck uric acid today, adjust allopurinol if necessary Check on lipids today, he is willing to consider a statin if necessary Routine HIV, hep C screening Encouraged flu shot and COVID-19 booster later this fall Will plan further follow- up pending labs.  This visit occurred during the SARS-CoV-2 public health emergency.  Safety protocols were in place, including screening questions prior to the visit, additional usage of staff PPE, and extensive cleaning of exam room while observing appropriate contact time as indicated for disinfecting solutions.    Signed Lamar Blinks, MD  Addendum 9/28, received labs as below  Results for orders placed or performed in visit on 02/20/20  Lipid panel  Result Value Ref Range   Cholesterol 170 <200 mg/dL   HDL 63 > OR = 40 mg/dL   Triglycerides 153 (H) <150 mg/dL   LDL Cholesterol (Calc) 82 mg/dL (calc)   Total CHOL/HDL Ratio 2.7 <5.0 (calc)   Non-HDL Cholesterol (Calc) 107 <130 mg/dL (calc)  Uric acid  Result Value Ref Range   Uric Acid, Serum 6.9 4.0 - 8.0 mg/dL   Message to patient

## 2020-02-20 ENCOUNTER — Encounter: Payer: Self-pay | Admitting: Family Medicine

## 2020-02-20 ENCOUNTER — Ambulatory Visit: Payer: BC Managed Care – PPO | Admitting: Family Medicine

## 2020-02-20 ENCOUNTER — Other Ambulatory Visit: Payer: Self-pay

## 2020-02-20 VITALS — BP 136/88 | HR 76 | Resp 16 | Ht 71.0 in | Wt 217.0 lb

## 2020-02-20 DIAGNOSIS — Z13220 Encounter for screening for lipoid disorders: Secondary | ICD-10-CM

## 2020-02-20 DIAGNOSIS — M1A0790 Idiopathic chronic gout, unspecified ankle and foot, without tophus (tophi): Secondary | ICD-10-CM

## 2020-02-20 DIAGNOSIS — Z1159 Encounter for screening for other viral diseases: Secondary | ICD-10-CM | POA: Diagnosis not present

## 2020-02-20 DIAGNOSIS — Z114 Encounter for screening for human immunodeficiency virus [HIV]: Secondary | ICD-10-CM | POA: Diagnosis not present

## 2020-02-20 NOTE — Patient Instructions (Signed)
Good to see you again today!  I will be in touch with your labs and we can think about increasing allopurinol dose if needed, and/ or cholesterol med continue to work on exercise and weight management Flu shot and covid booster this fall  Take care!  Schedule an eye exam at your convenience

## 2020-02-21 ENCOUNTER — Encounter: Payer: Self-pay | Admitting: Family Medicine

## 2020-02-21 DIAGNOSIS — M1A0790 Idiopathic chronic gout, unspecified ankle and foot, without tophus (tophi): Secondary | ICD-10-CM

## 2020-02-21 LAB — HEPATITIS C ANTIBODY
Hepatitis C Ab: NONREACTIVE
SIGNAL TO CUT-OFF: 0 (ref <1.00)

## 2020-02-21 LAB — HIV ANTIBODY (ROUTINE TESTING W REFLEX): HIV 1&2 Ab, 4th Generation: NONREACTIVE

## 2020-02-21 LAB — LIPID PANEL
Cholesterol: 170 mg/dL (ref <200)
HDL: 63 mg/dL (ref >=40)
LDL Cholesterol (Calc): 82 mg/dL (calc)
Non-HDL Cholesterol (Calc): 107 mg/dL (calc) (ref <130)
Total CHOL/HDL Ratio: 2.7 (calc) (ref <5.0)
Triglycerides: 153 mg/dL — ABNORMAL HIGH (ref <150)

## 2020-02-21 LAB — URIC ACID: Uric Acid, Serum: 6.9 mg/dL (ref 4.0–8.0)

## 2020-02-21 MED ORDER — ALLOPURINOL 300 MG PO TABS: 300.0000 mg | ORAL_TABLET | Freq: Every day | ORAL | 3 refills | Status: DC

## 2020-04-28 ENCOUNTER — Ambulatory Visit: Payer: BC Managed Care – PPO

## 2021-01-28 ENCOUNTER — Other Ambulatory Visit: Payer: Self-pay | Admitting: Family Medicine

## 2021-01-28 DIAGNOSIS — M1A0790 Idiopathic chronic gout, unspecified ankle and foot, without tophus (tophi): Secondary | ICD-10-CM

## 2021-04-01 NOTE — Progress Notes (Addendum)
[place] at [facility] [address], [place], [place] [address] [phone]  Date:  04/03/2021   Name:  Chad Galloway   DOB:  1976/01/01   MRN:  [id number]  PCP:  Shelda Pal, DO    Chief Complaint: Medication Refill (Concerns/ questions: L ear discomfort x 2 weeks- this happens with every sinus issue he has. He would like to see ENT. He ruptured that ear drum as a child. /Flu shot today: will get with booster at [facility]/)   History of Present Illness:  Chad Galloway is a 45 y.o. very pleasant male patient who presents with the following:  Primary patient of my partner Dr. Nani Ravens, here today with concern of chronic gout and medication refill  Last visit in the office was actually with myself in September of 2021 He is taking allopurinol 300 mg daily- working well for him, no sx recently   He has been noted to have elevated uric acid in the past Could use updated lab work today Flu vaccine-he plans to do along with COVID booster at pharmacy  He notes a feeling of fullness in the left ear only He did have a cold recently and they tend to go to his ear  He has noted sinus congestion and pressure for about 3 weeks, and some discomfort in the left ear.  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   TYMPANOPLASTY  as child   post op nausea and vomiting    Social History   Tobacco Use   Smoking status: Some Days    Types: Cigars   Smokeless tobacco: Never  Vaping Use   Vaping Use: Never used  Substance Use Topics   Alcohol use: Yes     Alcohol/week: 6.0 standard drinks    Types: 6 Cans of beer per week   Drug use: Not Currently    Family History  Problem Relation Age of Onset   Gout Father    Colon polyps Father    Gout Sister    Colon cancer Neg Hx    Esophageal cancer Neg Hx    Rectal cancer Neg Hx    Stomach cancer Neg Hx     No Known Allergies  Medication list has been reviewed and updated.  Current Outpatient Medications on File Prior to Visit  Medication Sig Dispense Refill   augmented betamethasone dipropionate (DIPROLENE-AF) 0.05 % ointment Apply 1 application topically 2 (two) times daily as needed.     ketoconazole (NIZORAL) 2 % shampoo Apply 1 application topically 3 (three) times a week.     mometasone (ELOCON) 0.1 % lotion Apply 1 application topically daily as needed.     predniSONE (STERAPRED UNI-PAK 48 TAB) 5 MG (48) TBPK tablet Take 5 mg by mouth daily.     No current facility-administered medications on file prior to visit.    Review of Systems:  As per HPI- otherwise negative.   Physical Examination: Vitals:  04/03/21 1521  BP: 134/80  Pulse: 90  Resp: 18  Temp: 97.7 F (36.5 C)  SpO2: 97%   Vitals:   04/03/21 1521  Weight: 215 lb 3.2 oz (97.6 kg)  Height: 5\' 11"  (1.803 m)   Body mass index is 30.01 kg/m. Ideal Body Weight: Weight in (lb) to have BMI = 25: 178.9  GEN: no acute distress.  Mildly obese, looks well HEENT: Atraumatic, Normocephalic.  Ears and Nose: No external deformity. CV: RRR, No M/G/R. No JVD. No thrill. No extra heart sounds. PULM: CTA B, no wheezes, crackles, rhonchi. No retractions. No resp. distress. No accessory muscle use. ABD: S, NT, ND EXTR: No c/c/e PSYCH: Normally interactive. Conversant.  Left TM shows diffuse scarring consistent with history of TM rupture and tubes as a child No evidence of acute otitis is appreciated  Assessment and Plan: Chronic gout of ankle, unspecified cause, unspecified laterality - Plan: allopurinol (ZYLOPRIM)  300 MG tablet, Uric acid  Screening for hyperlipidemia - Plan: Lipid panel  Screening for deficiency anemia - Plan: CBC  Screening for diabetes mellitus - Plan: Comprehensive metabolic panel, Hemoglobin A1c  Acute non-recurrent frontal sinusitis - Plan: amoxicillin (AMOXIL) 500 MG capsule  Disorder of tympanic membrane of left ear - Plan: Ambulatory referral to ENT  Patient seen today for follow-up.  Labs are pending as above Referral to ENT regarding his left ear Prescription for amoxicillin for likely sinusitis given 3 weeks of sinus congestion symptoms Will plan further follow- up pending labs.   Signed Lamar Blinks, MD  Addendum 11/10, received labs as below.  Message to patient Results for orders placed or performed in visit on 04/03/21  CBC  Result Value Ref Range   WBC 6.3 4.0 - 10.5 K/uL   RBC 4.78 4.22 - 5.81 Mil/uL   Platelets 227.0 150.0 - 400.0 K/uL   Hemoglobin 15.6 13.0 - 17.0 g/dL   HCT 45.5 39.0 - 52.0 %   MCV 95.2 78.0 - 100.0 fl   MCHC 34.3 30.0 - 36.0 g/dL   RDW 12.9 11.5 - 15.5 %  Comprehensive metabolic panel  Result Value Ref Range   Sodium 139 135 - 145 mEq/L   Potassium 4.1 3.5 - 5.1 mEq/L   Chloride 103 96 - 112 mEq/L   CO2 26 19 - 32 mEq/L   Glucose, Bld 77 70 - 99 mg/dL   BUN 15 6 - 23 mg/dL   Creatinine, Ser 0.98 0.40 - 1.50 mg/dL   Total Bilirubin 0.5 0.2 - 1.2 mg/dL   Alkaline Phosphatase 76 39 - 117 U/L   AST 27 0 - 37 U/L   ALT 33 0 - 53 U/L   Total Protein 6.9 6.0 - 8.3 g/dL   Albumin 4.8 3.5 - 5.2 g/dL   GFR 91.89 >60.00 mL/min   Calcium 9.2 8.4 - 10.5 mg/dL  Hemoglobin A1c  Result Value Ref Range   Hgb A1c MFr Bld 5.3 4.6 - 6.5 %  Lipid panel  Result Value Ref Range   Cholesterol 198 0 - 200 mg/dL   Triglycerides (H) 0.0 - 149.0 mg/dL    439.0 Triglyceride is over 400; calculations on Lipids are invalid.   HDL 52.40 >39.00 mg/dL   Total CHOL/HDL Ratio 4   Uric acid  Result Value Ref Range   Uric Acid, Serum 5.7 4.0 -  7.8 mg/dL  LDL cholesterol, direct  Result Value Ref Range   Direct LDL 116.0 mg/dL

## 2021-04-03 ENCOUNTER — Other Ambulatory Visit: Payer: Self-pay

## 2021-04-03 ENCOUNTER — Ambulatory Visit: Payer: BC Managed Care – PPO | Admitting: Family Medicine

## 2021-04-03 ENCOUNTER — Encounter: Payer: Self-pay | Admitting: Family Medicine

## 2021-04-03 VITALS — BP 134/80 | HR 90 | Temp 97.7°F | Resp 18 | Ht 71.0 in | Wt 215.2 lb

## 2021-04-03 DIAGNOSIS — Z13220 Encounter for screening for lipoid disorders: Secondary | ICD-10-CM

## 2021-04-03 DIAGNOSIS — M1A0790 Idiopathic chronic gout, unspecified ankle and foot, without tophus (tophi): Secondary | ICD-10-CM

## 2021-04-03 DIAGNOSIS — J0110 Acute frontal sinusitis, unspecified: Secondary | ICD-10-CM

## 2021-04-03 DIAGNOSIS — Z130 Encounter for screening for diseases of the blood and blood-forming organs and certain disorders involving the immune mechanism: Secondary | ICD-10-CM | POA: Diagnosis not present

## 2021-04-03 DIAGNOSIS — Z131 Encounter for screening for diabetes mellitus: Secondary | ICD-10-CM | POA: Diagnosis not present

## 2021-04-03 DIAGNOSIS — H7392 Unspecified disorder of tympanic membrane, left ear: Secondary | ICD-10-CM

## 2021-04-03 MED ORDER — AMOXICILLIN 500 MG PO CAPS: 1000.0000 mg | ORAL_CAPSULE | Freq: Two times a day (BID) | ORAL | 0 refills | Status: DC

## 2021-04-03 MED ORDER — ALLOPURINOL 300 MG PO TABS: 300.0000 mg | ORAL_TABLET | Freq: Every day | ORAL | 3 refills | Status: DC

## 2021-04-03 NOTE — Patient Instructions (Signed)
Good to see you today, I will be in touch with your labs soon as possible.  Please plan to get your flu and COVID shots at your pharmacy at your earliest convenience  Prescription for amoxicillin sent to your pharmacy for sinus infection/congestion and ear pain You might also try using a nasal decongestant spray like Afrin for a few days when you have these your symptoms  Referral sent to ENT to evaluate your chronic left ear problem  We are sorry for the recent loss of your pet!

## 2021-04-04 ENCOUNTER — Encounter: Payer: Self-pay | Admitting: Family Medicine

## 2021-04-04 LAB — CBC
HCT: 45.5 % (ref 39.0–52.0)
Hemoglobin: 15.6 g/dL (ref 13.0–17.0)
MCHC: 34.3 g/dL (ref 30.0–36.0)
MCV: 95.2 fl (ref 78.0–100.0)
Platelets: 227 10*3/uL (ref 150.0–400.0)
RBC: 4.78 Mil/uL (ref 4.22–5.81)
RDW: 12.9 % (ref 11.5–15.5)
WBC: 6.3 10*3/uL (ref 4.0–10.5)

## 2021-04-04 LAB — COMPREHENSIVE METABOLIC PANEL
ALT: 33 U/L (ref 0–53)
AST: 27 U/L (ref 0–37)
Albumin: 4.8 g/dL (ref 3.5–5.2)
Alkaline Phosphatase: 76 U/L (ref 39–117)
BUN: 15 mg/dL (ref 6–23)
CO2: 26 mEq/L (ref 19–32)
Calcium: 9.2 mg/dL (ref 8.4–10.5)
Chloride: 103 mEq/L (ref 96–112)
Creatinine, Ser: 0.98 mg/dL (ref 0.40–1.50)
GFR: 91.89 mL/min (ref >60.00)
Glucose, Bld: 77 mg/dL (ref 70–99)
Potassium: 4.1 mEq/L (ref 3.5–5.1)
Sodium: 139 mEq/L (ref 135–145)
Total Bilirubin: 0.5 mg/dL (ref 0.2–1.2)
Total Protein: 6.9 g/dL (ref 6.0–8.3)

## 2021-04-04 LAB — LDL CHOLESTEROL, DIRECT: Direct LDL: 116 mg/dL

## 2021-04-04 LAB — LIPID PANEL
Cholesterol: 198 mg/dL (ref 0–200)
HDL: 52.4 mg/dL (ref >39.00)
Total CHOL/HDL Ratio: 4
Triglycerides: 439 mg/dL — ABNORMAL HIGH (ref 0.0–149.0)

## 2021-04-04 LAB — URIC ACID: Uric Acid, Serum: 5.7 mg/dL (ref 4.0–7.8)

## 2021-04-04 LAB — HEMOGLOBIN A1C: Hgb A1c MFr Bld: 5.3 % (ref 4.6–6.5)

## 2021-04-26 NOTE — Progress Notes (Signed)
[place] at [facility] [place], [place], [place] [address] [phone]  Date:  04/29/2021   Name:  Chad Galloway   DOB:  January 06, 1976   MRN:  [id number]  PCP:  Shelda Pal, DO    Chief Complaint: Annual Exam (Here for Annual Exam )   History of Present Illness:  Chad Galloway is a 45 y.o. very pleasant male patient who presents with the following:  Pt seen today for a CPE - pt of Dr Nani Ravens, I saw him for gout last month  History of gout, otherwise generally in good health We did his labs last month   Covid booster-recommended Flu vaccine- give today  Colon UTD  He is not fasting right now He is not aware of any family history of prostate cancer Colon cancer screening UTD  He wants to try and exercise more- he does bowl, golf, takes his dog for a walk  He does smoke cigars 1-2x a week He drinks about one alcoholic beverage a day- perhaps a bit more.  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   TYMPANOPLASTY  as child   post op nausea and vomiting    Social History   Tobacco Use   Smoking status: Some Days    Types: Cigars   Smokeless tobacco: Never  Vaping Use   Vaping Use: Never used  Substance Use Topics   Alcohol use: Yes    Alcohol/week: 6.0 standard drinks    Types: 6 Cans of beer per week   Drug use: Not Currently    Family History  Problem Relation Age of Onset   Gout Father    Colon polyps Father    Gout Sister    Colon cancer Neg Hx    Esophageal cancer Neg Hx     Rectal cancer Neg Hx    Stomach cancer Neg Hx     No Known Allergies  Medication list has been reviewed and updated.  Current Outpatient Medications on File Prior to Visit  Medication Sig Dispense Refill   allopurinol (ZYLOPRIM) 300 MG tablet Take 1 tablet (300 mg total) by mouth daily. 90 tablet 3   amoxicillin (AMOXIL) 500 MG capsule Take 2 capsules (1,000 mg total) by mouth 2 (two) times daily. 40 capsule 0   augmented betamethasone dipropionate (DIPROLENE-AF) 0.05 % ointment Apply 1 application topically 2 (two) times daily as needed.     ketoconazole (NIZORAL) 2 % shampoo Apply 1 application topically 3 (three) times a week.     mometasone (ELOCON) 0.1 % lotion Apply 1 application topically daily as needed.     predniSONE (STERAPRED UNI-PAK 48 TAB) 5 MG (48) TBPK tablet Take 5 mg by mouth daily.     No current facility-administered medications on file prior to visit.    Review of Systems:  As per HPI- otherwise negative.  Wt Readings from Last 3 Encounters:  04/29/21 218  lb (98.9 kg)  04/03/21 215 lb 3.2 oz (97.6 kg)  02/20/20 217 lb (98.4 kg)   BP Readings from Last 3 Encounters:  04/29/21 130/74  04/03/21 134/80  02/20/20 136/88    Physical Examination: Vitals:   04/29/21 1333  BP: 130/74  Pulse: 96  Resp: 16  Temp: 97.9 F (36.6 C)  SpO2: 98%   Vitals:   04/29/21 1333  Weight: 218 lb (98.9 kg)  Height: 5\' 10"  (1.778 m)   Body mass index is 31.28 kg/m. Ideal Body Weight: Weight in (lb) to have BMI = 25: 173.9  GEN: no acute distress.  Mild obesity, looks well HEENT: Atraumatic, Normocephalic. Bilateral TM wnl, oropharynx normal.  PEERL,EOMI.   Ears and Nose: No external deformity. CV: RRR, No M/G/R. No JVD. No thrill. No extra heart sounds. PULM: CTA B, no wheezes, crackles, rhonchi. No retractions. No resp. distress. No accessory muscle use. ABD: S, NT, ND EXTR: No c/c/e PSYCH: Normally interactive. Conversant.  Thoracolumbar flexibility is  reduced.  Negative straight leg raise. He has some tightness and spasm in the bilateral thoracic lumbar paraspinous muscles  Assessment and Plan: Physical exam  Dyslipidemia - Plan: Lipid panel  Need for influenza vaccination - Plan: Flu Vaccine QUAD 6+ mos PF IM (Fluarix Quad PF)  Repetitive strain injury of lower back, initial encounter  Physical exam today -encouraged healthy diet and exercise routine.  Encouraged cutting back over ceasing use of cigars, decreased alcohol use.  He plans to work on these measures  He will come in for a fasting cholesterol panel in the next few months  Flu shot given  Likely muscular lower back pain.  I encouraged him to work on core strengthening to take some pressure off his lower back.  Pilates and yoga can be especially helpful Signed Lamar Blinks, MD

## 2021-04-26 NOTE — Patient Instructions (Addendum)
Good to see you today!  Flu shot today Your labs are UTD- please schedule a fasting cholesterol panel in a few months  Work on your abdominal and core strength for your lower back pain

## 2021-04-29 ENCOUNTER — Ambulatory Visit (INDEPENDENT_AMBULATORY_CARE_PROVIDER_SITE_OTHER): Payer: BC Managed Care – PPO | Admitting: Family Medicine

## 2021-04-29 ENCOUNTER — Encounter: Payer: Self-pay | Admitting: Family Medicine

## 2021-04-29 VITALS — BP 130/74 | HR 96 | Temp 97.9°F | Resp 16 | Ht 70.0 in | Wt 218.0 lb

## 2021-04-29 DIAGNOSIS — Z0000 Encounter for general adult medical examination without abnormal findings: Secondary | ICD-10-CM

## 2021-04-29 DIAGNOSIS — Z23 Encounter for immunization: Secondary | ICD-10-CM

## 2021-04-29 DIAGNOSIS — S39012A Strain of muscle, fascia and tendon of lower back, initial encounter: Secondary | ICD-10-CM

## 2021-04-29 DIAGNOSIS — X503XXA Overexertion from repetitive movements, initial encounter: Secondary | ICD-10-CM

## 2021-04-29 DIAGNOSIS — E785 Hyperlipidemia, unspecified: Secondary | ICD-10-CM

## 2021-08-12 ENCOUNTER — Other Ambulatory Visit (INDEPENDENT_AMBULATORY_CARE_PROVIDER_SITE_OTHER): Payer: BC Managed Care – PPO

## 2021-08-12 ENCOUNTER — Encounter: Payer: Self-pay | Admitting: Family Medicine

## 2021-08-12 DIAGNOSIS — E785 Hyperlipidemia, unspecified: Secondary | ICD-10-CM | POA: Diagnosis not present

## 2021-08-12 LAB — LIPID PANEL
Cholesterol: 176 mg/dL (ref 0–200)
HDL: 51.3 mg/dL (ref >39.00)
LDL Cholesterol: 85 mg/dL (ref 0–99)
NonHDL: 124.34
Total CHOL/HDL Ratio: 3
Triglycerides: 199 mg/dL — ABNORMAL HIGH (ref 0.0–149.0)
VLDL: 39.8 mg/dL (ref 0.0–40.0)

## 2022-01-31 ENCOUNTER — Ambulatory Visit: Payer: BC Managed Care – PPO | Admitting: Medical

## 2022-01-31 ENCOUNTER — Encounter: Payer: Self-pay | Admitting: Medical

## 2022-01-31 VITALS — BP 138/80 | HR 83 | Resp 18 | Ht 70.0 in | Wt 216.0 lb

## 2022-01-31 DIAGNOSIS — W57XXXA Bitten or stung by nonvenomous insect and other nonvenomous arthropods, initial encounter: Secondary | ICD-10-CM

## 2022-01-31 DIAGNOSIS — R030 Elevated blood-pressure reading, without diagnosis of hypertension: Secondary | ICD-10-CM

## 2022-01-31 DIAGNOSIS — S30861A Insect bite (nonvenomous) of abdominal wall, initial encounter: Secondary | ICD-10-CM | POA: Diagnosis not present

## 2022-01-31 MED ORDER — DOXYCYCLINE HYCLATE 100 MG PO TABS: 100.0000 mg | ORAL_TABLET | Freq: Two times a day (BID) | ORAL | 0 refills | Status: DC

## 2022-01-31 NOTE — Progress Notes (Signed)
Subjective:    Patient ID: Chad Galloway, male    DOB: 1976-04-21, 46 y.o.   MRN: [id number]  HPI  Pt in for  recent tick bite rt groin area and lower abdomen area. Pt states pulled off tick relatively easy. He estimates tick was there for 2 days. Pulled off tick on wed. He thinks may have attached on Monday. No fever, no chills, no body aches or fatigue.   Notes bruised rim around where tick was attached.  Bp high when first checked. Borderline bp at home when he checks. Explains 130/85.      Review of Systems  Constitutional:  Negative for chills, fatigue and fever.  HENT:  Negative for congestion, drooling and ear discharge.   Respiratory:  Negative for chest tightness, shortness of breath and wheezing.   Cardiovascular:  Negative for chest pain and palpitations.  Gastrointestinal:  Negative for abdominal pain.  Genitourinary:  Negative for dysuria and flank pain.  Musculoskeletal:  Negative for back pain, joint swelling and neck stiffness.  Skin:  Positive for rash.  Neurological:  Negative for dizziness, syncope, facial asymmetry, weakness, light-headedness and numbness.  Hematological:  Does not bruise/bleed easily.  Psychiatric/Behavioral:  Negative for behavioral problems, decreased concentration and dysphoric mood.     Past Medical History:  Diagnosis Date   Allergy    Gout      Social History   Socioeconomic History   Marital status: Married    Spouse name: Not on file   Number of children: Not on file   Years of education: Not on file   Highest education level: Not on file  Occupational History   Not on file  Tobacco Use   Smoking status: Some Days    Types: Cigars   Smokeless tobacco: Never  Vaping Use   Vaping Use: Never used  Substance and Sexual Activity   Alcohol use: Yes    Alcohol/week: 6.0 standard drinks of alcohol    Types: 6 Cans of beer per week   Drug use: Not Currently   Sexual activity: Not on file  Other Topics Concern   Not  on file  Social History Narrative   Not on file   Social Determinants of Health   Financial Resource Strain: Not on file  Food Insecurity: Not on file  Transportation Needs: Not on file  Physical Activity: Not on file  Stress: Not on file  Social Connections: Not on file  Intimate Partner Violence: Not on file    Past Surgical History:  Procedure Laterality Date   COLONOSCOPY  + 15 years ago    At [facility]="benign polyp" per pt.   TYMPANOPLASTY  as child   post op nausea and vomiting    Family History  Problem Relation Age of Onset   Gout Father    Colon polyps Father    Gout Sister    Colon cancer Neg Hx    Esophageal cancer Neg Hx    Rectal cancer Neg Hx    Stomach cancer Neg Hx     No Known Allergies  Current Outpatient Medications on File Prior to Visit  Medication Sig Dispense Refill   allopurinol (ZYLOPRIM) 300 MG tablet Take 1 tablet (300 mg total) by mouth daily. 90 tablet 3   No current facility-administered medications on file prior to visit.    BP 138/80   Pulse 83   Resp 18   Ht '5\' 10"'$  (1.778 m)   Wt 216 lb (98 kg)  SpO2 98%   BMI 30.99 kg/m           Objective:   Physical Exam  General- No acute distress. Pleasant patient. Lungs- Clear, even and unlabored. Heart- regular rate and rhythm. Neurologic- CNII- XII grossly intact.  Skin- rt groin/lower abd circular bruised rash with central clearing. Center clear area about 10 mm wide.     Assessment & Plan:   Patient Instructions  Tick bite with appearance of possible bulls eye rash. On inspection edges of area where tick attached looked bruise. Early in presentation so tick bite studies would likely be potentially false negative. Treat clinically and cover rmsf and lyme. Rx doxycycline. Rx advisement given.   2nd check bp better. Keep checking at home. If bp increasing over 140/90 would recommend discuss with pcp as may need bp med.  Follow up as regularly scheduled with pcp or sooner  if symptoms worsen or change.      Mackie Pai, PA-C

## 2022-01-31 NOTE — Patient Instructions (Addendum)
Tick bite with appearance of possible bulls eye rash. On inspection edges of area where tick attached looked bruise. Early in presentation so tick bite studies would likely be potentially false negative. Treat clinically and cover rmsf and lyme. Rx doxycycline. Rx advisement given.   2nd check bp better. Keep checking at home. If bp increasing over 140/90 would recommend discuss with pcp as may need bp med.  Follow up as regularly scheduled with pcp or sooner if symptoms worsen or change.

## 2022-05-18 ENCOUNTER — Other Ambulatory Visit: Payer: Self-pay | Admitting: Family Medicine

## 2022-05-18 DIAGNOSIS — M1A0790 Idiopathic chronic gout, unspecified ankle and foot, without tophus (tophi): Secondary | ICD-10-CM

## 2023-01-08 ENCOUNTER — Encounter (INDEPENDENT_AMBULATORY_CARE_PROVIDER_SITE_OTHER): Payer: Self-pay

## 2023-02-05 NOTE — Patient Instructions (Addendum)
Good to see you again today I will be in touch with your labs asap  Recommend covid and flu shots this fall  I am sorry to hear about the loss of your father!  Recommend cut down or stop smoking, and limit alcohol, increase exercise as you are able We can start lung cancer screening CT for you next year Please set up your coronary calcium screening test with the [facility] imaging at your convenience  Phone: [phone]

## 2023-02-05 NOTE — Progress Notes (Signed)
[facility] [address] [place], [place] [address] [phone]  Date:  02/09/2023   Name:  Chad Galloway   DOB:  08-Oct-1975   MRN:  [id number]  PCP:  Pearline Cables, MD    Chief Complaint: Annual Exam (Concerns/ questions: 1. Family hx of Dementia, what should he be looking for?/Flu shot today: declines/)   History of Present Illness:  Chad Galloway is a 47 y.o. very pleasant male patient who presents with the following:  Pt seen today for a CPE Last seen by myself 12/22 At that time he was smoking 1-2 cigars a week and drinking etoh most days, he wanted to cut back- he has started doing a bit more exercise  He did smoke cigs for about 15 years; he smoked a pack per day, and has smoked cigars since.  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   TYMPANOPLASTY  as child   post op nausea and vomiting    Social History   Tobacco Use   Smoking status: Some Days    Types: Cigars   Smokeless tobacco: Never  Vaping Use   Vaping status: Never Used  Substance Use Topics   Alcohol use: Yes    Alcohol/week: 6.0 standard drinks of alcohol    Types: 6 Cans of beer per week   Drug use: Not Currently    Family History  Problem Relation Age of Onset   Gout Father    Colon polyps Father    Gout Sister    Colon cancer Neg Hx    Esophageal cancer Neg Hx    Rectal cancer Neg Hx    Stomach cancer Neg Hx     No Known Allergies  Medication list has been reviewed and  updated.  Current Outpatient Medications on File Prior to Visit  Medication Sig Dispense Refill   allopurinol (ZYLOPRIM) 300 MG tablet TAKE 1 TABLET BY MOUTH EVERY DAY 90 tablet 3   No current facility-administered medications on file prior to visit.    Review of Systems:  As per HPI- otherwise negative.   Physical Examination: Vitals:   02/09/23 0829  BP: 120/80  Pulse: 75  Resp: 18  Temp: 98.2 F (36.8 C)  SpO2: 98%   Vitals:   02/09/23 0829  Weight: 213 lb 6.4 oz (96.8 kg)  Height: 5\' 10"  (1.778 m)   Body mass index is 30.62 kg/m. Ideal Body Weight: Weight in (lb) to have BMI = 25: 173.9  GEN: no acute distress. Mild obesity, looks well  HEENT: Atraumatic, Normocephalic.  Bilateral TM wnl, oropharynx normal.  PEERL,EOMI.   Ears and Nose: No external deformity. CV: RRR, No M/G/R. No JVD. No thrill. No extra heart sounds. PULM: CTA B, no wheezes, crackles, rhonchi. No retractions. No resp. distress. No accessory muscle use. ABD: S, NT, ND, +BS. No rebound. No HSM. EXTR: No c/c/e PSYCH: Normally interactive. Conversant.  Assessment and Plan: Physical exam  Screening for hyperlipidemia - Plan: Lipid panel, CT CARDIAC SCORING (SELF PAY ONLY)  Screening for deficiency anemia - Plan: CBC  Elevated blood pressure, situational  Screening for diabetes mellitus - Plan: Comprehensive metabolic panel, Hemoglobin A1c  Screening for malignant neoplasm of prostate - Plan: PSA  Chronic gout of ankle, unspecified cause, unspecified laterality - Plan: allopurinol (ZYLOPRIM) 300 MG tablet  Physical exam- encouraged healthy diet and exercise routine Will plan further follow- up pending labs. Encouraged him to cut down or stop smoking- he should actually qualify for lung cancer screening starting next year at age 51 Ordered lung cancer screening CT  Signed Abbe Amsterdam, MD  Received labs as below, message to patient  Results for orders placed or performed in  visit on 02/09/23  CBC  Result Value Ref Range   WBC 5.3 4.0 - 10.5 K/uL   RBC 4.90 4.22 - 5.81 Mil/uL   Platelets 232.0 150.0 - 400.0 K/uL   Hemoglobin 15.8 13.0 - 17.0 g/dL   HCT 64.4 03.4 - 74.2 %   MCV 96.4 78.0 - 100.0 fl   MCHC 33.4 30.0 - 36.0 g/dL   RDW 59.5 63.8 - 75.6 %  Comprehensive metabolic panel  Result Value Ref Range   Sodium 140 135 - 145 mEq/L   Potassium 4.4 3.5 - 5.1 mEq/L   Chloride 104 96 - 112 mEq/L   CO2 26 19 - 32 mEq/L   Glucose, Bld 113 (H) 70 - 99 mg/dL   BUN 20 6 - 23 mg/dL   Creatinine, Ser 4.33 0.40 - 1.50 mg/dL   Total Bilirubin 0.6 0.2 - 1.2 mg/dL   Alkaline Phosphatase 73 39 - 117 U/L   AST 16 0 - 37 U/L   ALT 23 0 - 53 U/L   Total Protein 6.5 6.0 - 8.3 g/dL   Albumin 4.3 3.5 - 5.2 g/dL   GFR 29.51 >88.41 mL/min   Calcium 9.2 8.4 - 10.5 mg/dL  Hemoglobin Y6A  Result Value Ref Range   Hgb A1c MFr Bld 5.1 4.6 - 6.5 %  Lipid panel  Result Value Ref Range   Cholesterol 165 0 - 200 mg/dL   Triglycerides 630.1 (H) 0.0 - 149.0 mg/dL   HDL 60.10 >93.23 mg/dL   VLDL 55.7 0.0 - 32.2 mg/dL   LDL Cholesterol 82 0 - 99 mg/dL   Total CHOL/HDL Ratio 3    NonHDL 113.51   PSA  Result Value Ref Range   PSA 1.01 0.10 - 4.00 ng/mL

## 2023-02-09 ENCOUNTER — Ambulatory Visit (INDEPENDENT_AMBULATORY_CARE_PROVIDER_SITE_OTHER): Payer: BC Managed Care – PPO | Admitting: Family Medicine

## 2023-02-09 ENCOUNTER — Encounter: Payer: Self-pay | Admitting: Family Medicine

## 2023-02-09 VITALS — BP 120/80 | HR 75 | Temp 98.2°F | Resp 18 | Ht 70.0 in | Wt 213.4 lb

## 2023-02-09 DIAGNOSIS — Z131 Encounter for screening for diabetes mellitus: Secondary | ICD-10-CM

## 2023-02-09 DIAGNOSIS — Z125 Encounter for screening for malignant neoplasm of prostate: Secondary | ICD-10-CM | POA: Diagnosis not present

## 2023-02-09 DIAGNOSIS — Z0000 Encounter for general adult medical examination without abnormal findings: Secondary | ICD-10-CM

## 2023-02-09 DIAGNOSIS — Z130 Encounter for screening for diseases of the blood and blood-forming organs and certain disorders involving the immune mechanism: Secondary | ICD-10-CM

## 2023-02-09 DIAGNOSIS — Z13220 Encounter for screening for lipoid disorders: Secondary | ICD-10-CM | POA: Diagnosis not present

## 2023-02-09 DIAGNOSIS — M1A0790 Idiopathic chronic gout, unspecified ankle and foot, without tophus (tophi): Secondary | ICD-10-CM

## 2023-02-09 DIAGNOSIS — R030 Elevated blood-pressure reading, without diagnosis of hypertension: Secondary | ICD-10-CM | POA: Diagnosis not present

## 2023-02-09 LAB — CBC
HCT: 47.3 % (ref 39.0–52.0)
Hemoglobin: 15.8 g/dL (ref 13.0–17.0)
MCHC: 33.4 g/dL (ref 30.0–36.0)
MCV: 96.4 fl (ref 78.0–100.0)
Platelets: 232 10*3/uL (ref 150.0–400.0)
RBC: 4.9 Mil/uL (ref 4.22–5.81)
RDW: 13 % (ref 11.5–15.5)
WBC: 5.3 10*3/uL (ref 4.0–10.5)

## 2023-02-09 LAB — COMPREHENSIVE METABOLIC PANEL
ALT: 23 U/L (ref 0–53)
AST: 16 U/L (ref 0–37)
Albumin: 4.3 g/dL (ref 3.5–5.2)
Alkaline Phosphatase: 73 U/L (ref 39–117)
BUN: 20 mg/dL (ref 6–23)
CO2: 26 meq/L (ref 19–32)
Calcium: 9.2 mg/dL (ref 8.4–10.5)
Chloride: 104 meq/L (ref 96–112)
Creatinine, Ser: 1.19 mg/dL (ref 0.40–1.50)
GFR: 71.85 mL/min (ref >60.00)
Glucose, Bld: 113 mg/dL — ABNORMAL HIGH (ref 70–99)
Potassium: 4.4 meq/L (ref 3.5–5.1)
Sodium: 140 meq/L (ref 135–145)
Total Bilirubin: 0.6 mg/dL (ref 0.2–1.2)
Total Protein: 6.5 g/dL (ref 6.0–8.3)

## 2023-02-09 LAB — LIPID PANEL
Cholesterol: 165 mg/dL (ref 0–200)
HDL: 51.2 mg/dL (ref >39.00)
LDL Cholesterol: 82 mg/dL (ref 0–99)
NonHDL: 113.51
Total CHOL/HDL Ratio: 3
Triglycerides: 157 mg/dL — ABNORMAL HIGH (ref 0.0–149.0)
VLDL: 31.4 mg/dL (ref 0.0–40.0)

## 2023-02-09 LAB — PSA: PSA: 1.01 ng/mL (ref 0.10–4.00)

## 2023-02-09 LAB — HEMOGLOBIN A1C: Hgb A1c MFr Bld: 5.1 % (ref 4.6–6.5)

## 2023-02-09 MED ORDER — ALLOPURINOL 300 MG PO TABS
300.0000 mg | ORAL_TABLET | Freq: Every day | ORAL | 3 refills | Status: DC
Start: 2023-02-09 — End: 2024-02-10

## 2023-02-11 ENCOUNTER — Telehealth (HOSPITAL_BASED_OUTPATIENT_CLINIC_OR_DEPARTMENT_OTHER): Payer: Self-pay

## 2023-11-04 NOTE — Progress Notes (Signed)
 [facility] [address], [address] [place], [place] [address] [phone]  Date:  11/05/2023   Name:  Chad Galloway   DOB:  10/17/1975   MRN:  [id number]  PCP:  Kaylee Partridge, MD    Chief Complaint: Ear Fullness (Pt states he was seen by ENT and was told to try nasal spray with no relief /Pt states he does get frequent ear infections in L ear )   History of Present Illness:  Chad Galloway is a 48 y.o. very pleasant male patient who presents with the following:  Patient seen today with ear discomfort and ear feeling clogged.  Most recent visit with myself was last September for his physical Pt notes he tends to have allergies this time of year He has noted his left ear feeling clogged up an did not respond to flonase Does not hurt- pressure only The right ear is ok He notes the left ear tends to be more wax producing in general, the right ear does not typically get a wax impaction  BP Readings from Last 3 Encounters:  11/05/23 (!) 142/90  02/09/23 120/80  01/31/22 138/80     Patient Active Problem List   Diagnosis Date Noted   Chronic gout of ankle 06/07/2019    Past Medical History:  Diagnosis Date   Allergy    Gout     Past Surgical History:  Procedure Laterality Date   COLONOSCOPY  + 15 years ago    At [facility]=benign polyp per pt.   TYMPANOPLASTY  as child   post op nausea and vomiting    Social History   Tobacco Use   Smoking status: Some Days    Types: Cigars   Smokeless tobacco: Never  Vaping Use   Vaping status: Never Used  Substance Use Topics   Alcohol use: Yes    Alcohol/week: 6.0 standard drinks of alcohol    Types: 6 Cans of beer per week   Drug use: Not Currently    Family History  Problem Relation Age of Onset   Gout Father    Colon polyps Father    Gout Sister    Colon cancer Neg Hx    Esophageal cancer Neg Hx    Rectal cancer Neg Hx    Stomach cancer Neg Hx      No Known Allergies  Medication list has been reviewed and updated.  Current Outpatient Medications on File Prior to Visit  Medication Sig Dispense Refill   allopurinol  (ZYLOPRIM ) 300 MG tablet Take 1 tablet (300 mg total) by mouth daily. 90 tablet 3   clobetasol ointment (TEMOVATE) 0.05 % 1 application to flared plaques of the trunk (Not for use onface) Externally Twice a day; Duration: 14 days     No current facility-administered medications on file prior to visit.    Review of Systems:  As per HPI- otherwise negative.   Physical Examination: Vitals:   11/05/23 1541  BP: (!) 142/90  Pulse: 87  SpO2: 97%   Vitals:   11/05/23 1541  Weight: 218 lb (98.9 kg)  Height: 5' 10 (1.778 m)   Body mass index is 31.28 kg/m. Ideal Body Weight: Weight in (lb) to have BMI = 25: 173.9  GEN: No acute distress; alert,appropriate. PULM: Breathing comfortably in no respiratory distress PSYCH: Normally interactive.  Right-sided ear canal and tympanic membrane are normal.  Left ear canal is completely obscured with wax  Verbal consent obtained.  Ear was irrigated with warm water and cerumen is removed.  Patient notes relief of symptoms, exam shows a clear ear canal  Assessment and Plan: Impacted cerumen of left ear  Left ear cerumen impaction resolved as above.  Patient will let us  know if any other concerns Otherwise plan to see him for his physical in September  Signed Gates Kasal, MD

## 2023-11-05 ENCOUNTER — Encounter: Payer: Self-pay | Admitting: Family Medicine

## 2023-11-05 ENCOUNTER — Ambulatory Visit (INDEPENDENT_AMBULATORY_CARE_PROVIDER_SITE_OTHER): Admitting: Family Medicine

## 2023-11-05 VITALS — BP 142/90 | HR 87 | Ht 70.0 in | Wt 218.0 lb

## 2023-11-05 DIAGNOSIS — H6122 Impacted cerumen, left ear: Secondary | ICD-10-CM | POA: Diagnosis not present

## 2024-02-05 NOTE — Progress Notes (Signed)
  [facility] [address], [address] [place], [place] [address] [phone]  Date:  02/10/2024   Name:  Chad Galloway   DOB:  1975-07-02   MRN:  [id number]  PCP:  Watt Harlene BROCKS, MD    Chief Complaint: Annual Exam   History of Present Illness:  Chad Galloway is a 48 y.o. very pleasant male patient who presents with the following:  Pt seen today for a CPE- history of gout  I last saw him in June for an earwax issue  He should qualify for lung cancer screening based on previous cigarette and current cigar smoking  Married to [employer] - children are 30 and 67 yo  Labs can be updated Colon 2021 Flu shot- give today  Recommend shingles series    Discussed the use of AI scribe software for clinical note transcription with the patient, who gave verbal consent to proceed.  History of Present Illness Chad Galloway is a 48 year old male who presents for routine follow-up and lung cancer screening.  He has a history of heavy cigarette smoking, having smoked at least a pack a day from the age of 23 until his early 72s. He currently smokes cigars approximately once a week. He has not yet undergone lung cancer screening but is interested in pursuing it.  He has a history of gout, which is currently well-managed with medication. No recent flare-ups have occurred, and his current medication regimen is effective.  He has concerns about sleep apnea, noting that he wakes up early, snores heavily, and feels tired during the day. He has not been formally tested for sleep apnea. His wife reports that he snores and sometimes reaches out in his sleep. He has tried a mouth guard in the past without success.  He has a history of ADHD, diagnosed in first grade and treated with Ritalin until eighth grade. He reports current difficulties with focus, particularly during complex tasks, and is considering medication to help manage these  symptoms.  He is up to date with colonoscopy screenings, with his last colonoscopy performed on September 23, 2019.  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    Patient Active Problem List   Diagnosis Date Noted   Chronic gout of ankle 06/07/2019    Past Medical History:  Diagnosis Date   Allergy    Gout     Past Surgical History:  Procedure Laterality Date   COLONOSCOPY  + 15 years ago    At [facility]=benign polyp per pt.   TYMPANOPLASTY  as child   post op nausea and vomiting    Social History   Tobacco Use   Smoking status: Some Days    Types: Cigars   Smokeless tobacco: Never  Vaping Use   Vaping status: Never Used  Substance Use Topics   Alcohol use: Yes    Alcohol/week: 6.0 standard drinks of alcohol    Types: 6 Cans of beer per week   Drug use: Not Currently    Family History  Problem Relation Age of Onset   Gout Father    Colon polyps Father    Gout Sister    Colon cancer Neg Hx    Esophageal cancer Neg Hx    Rectal cancer Neg Hx    Stomach cancer Neg Hx     No Known Allergies  Medication list  has been reviewed and updated.  Current Outpatient Medications on File Prior to Visit  Medication Sig Dispense Refill   clobetasol ointment (TEMOVATE) 0.05 % 1 application to flared plaques of the trunk (Not for use onface) Externally Twice a day; Duration: 14 days     No current facility-administered medications on file prior to visit.    Review of Systems:  As per HPI- otherwise negative.   Physical Examination: Vitals:   02/10/24 0825  BP: 124/78  Pulse: 88  Temp: 97.8 F (36.6 C)  SpO2: 95%   Vitals:   02/10/24 0825  Weight: 220 lb (99.8 kg)  Height: 5' 10 (1.778 m)   Body mass index is 31.57 kg/m. Ideal Body Weight: Weight in (lb) to have BMI = 25: 173.9  GEN: no acute distress. Mild obesity  HEENT: Atraumatic, Normocephalic.  Bilateral TM wnl, oropharynx normal.   PEERL,EOMI.   Ears and Nose: No external deformity. CV: RRR, No M/G/R. No JVD. No thrill. No extra heart sounds. PULM: CTA B, no wheezes, crackles, rhonchi. No retractions. No resp. distress. No accessory muscle use. ABD: S, NT, ND EXTR: No c/c/e PSYCH: Normally interactive. Conversant.    Assessment and Plan: Physical exam  Screening for hyperlipidemia - Plan: Lipid panel  Screening for diabetes mellitus - Plan: Comprehensive metabolic panel with GFR, Hemoglobin A1c  Elevated blood pressure, situational - Plan: CBC, Comprehensive metabolic panel with GFR  Screening for deficiency anemia - Plan: CBC  Screening for malignant neoplasm of prostate - Plan: PSA  Chronic gout of ankle, unspecified cause, unspecified laterality - Plan: allopurinol  (ZYLOPRIM ) 300 MG tablet  Snoring - Plan: Ambulatory referral to Neurology  Tobacco abuse - Plan: CT CHEST LUNG CA SCREEN LOW DOSE W/O CM  Attention or concentration deficit - Plan: amphetamine -dextroamphetamine (ADDERALL XR) 10 MG 24 hr capsule  Need for influenza vaccination - Plan: Flu vaccine trivalent PF, 6mos and older(Flulaval,Afluria,Fluarix,Fluzone)  Assessment & Plan Adult Wellness Visit Routine wellness visit. Discussed lung cancer screening eligibility due to past heavy smoking. Shingles vaccination discussed, deferred due to work deadline.  - Order blood work: CBC, metabolic panel, A1c, cholesterol, PSA. - Order lung cancer screening. - Administer flu vaccination. - Discuss shingles vaccination later.  Suspected obstructive sleep apnea Symptoms suggestive of sleep apnea. Discussed CPAP therapy benefits and weight loss. Considered Tirzepatide for weight loss if diagnosed. - Arrange sleep apnea testing.  Attention-deficit hyperactivity disorder (ADHD), adult History of ADHD, currently experiencing focus issues. Discussed extended-release Adderall for focus improvement. - Prescribe extended-release Adderall, start 10 mg in  the morning, option for second dose at lunchtime or 20 mg in the morning. - Evaluate medication response in one month.  Gout Gout well-controlled with Allopurinol . - Refill Allopurinol  prescription.  Signed Harlene Schroeder, MD  Received labs, message to patient  Results for orders placed or performed in visit on 02/10/24  CBC   Collection Time: 02/10/24  8:55 AM  Result Value Ref Range   WBC 5.3 4.0 - 10.5 K/uL   RBC 4.69 4.22 - 5.81 Mil/uL   Platelets 236.0 150.0 - 400.0 K/uL   Hemoglobin 15.3 13.0 - 17.0 g/dL   HCT 55.0 60.9 - 47.9 %   MCV 95.8 78.0 - 100.0 fl   MCHC 34.2 30.0 - 36.0 g/dL   RDW 86.9 88.4 - 84.4 %  Comprehensive metabolic panel with GFR   Collection Time: 02/10/24  8:55 AM  Result Value Ref Range   Sodium 141 135 - 145 mEq/L   Potassium  4.4 3.5 - 5.1 mEq/L   Chloride 104 96 - 112 mEq/L   CO2 28 19 - 32 mEq/L   Glucose, Bld 91 70 - 99 mg/dL   BUN 17 6 - 23 mg/dL   Creatinine, Ser 9.04 0.40 - 1.50 mg/dL   Total Bilirubin 0.8 0.2 - 1.2 mg/dL   Alkaline Phosphatase 70 39 - 117 U/L   AST 17 0 - 37 U/L   ALT 22 0 - 53 U/L   Total Protein 6.6 6.0 - 8.3 g/dL   Albumin 4.5 3.5 - 5.2 g/dL   GFR 06.50 >39.99 mL/min   Calcium 9.3 8.4 - 10.5 mg/dL  Hemoglobin J8r   Collection Time: 02/10/24  8:55 AM  Result Value Ref Range   Hgb A1c MFr Bld 5.4 4.6 - 6.5 %  Lipid panel   Collection Time: 02/10/24  8:55 AM  Result Value Ref Range   Cholesterol 181 0 - 200 mg/dL   Triglycerides 857.9 0.0 - 149.0 mg/dL   HDL 56.29 >60.99 mg/dL   VLDL 71.5 0.0 - 59.9 mg/dL   LDL Cholesterol 890 (H) 0 - 99 mg/dL   Total CHOL/HDL Ratio 4    NonHDL 137.69   PSA   Collection Time: 02/10/24  8:55 AM  Result Value Ref Range   PSA 1.08 0.10 - 4.00 ng/mL

## 2024-02-05 NOTE — Patient Instructions (Addendum)
 Good to see you again today I will be in touch with your labs asap Stop by imaging and set up your lung cancer screening CT assuming you do qualify  Plan to do the shingles vaccine series at your convenience Flu shot today We can try adderall xr 10 mg- take 1 or 2 pills daily.  Can take just in the am or one am, one a few hours later.  Don't take too late to avoid disturbing your sleep I will set you up for a neurology consultation for sleep apnea testing

## 2024-02-10 ENCOUNTER — Ambulatory Visit: Payer: BC Managed Care – PPO | Admitting: Family Medicine

## 2024-02-10 ENCOUNTER — Encounter: Payer: Self-pay | Admitting: Family Medicine

## 2024-02-10 VITALS — BP 124/78 | HR 88 | Temp 97.8°F | Ht 70.0 in | Wt 220.0 lb

## 2024-02-10 DIAGNOSIS — R0683 Snoring: Secondary | ICD-10-CM

## 2024-02-10 DIAGNOSIS — Z13220 Encounter for screening for lipoid disorders: Secondary | ICD-10-CM | POA: Diagnosis not present

## 2024-02-10 DIAGNOSIS — R030 Elevated blood-pressure reading, without diagnosis of hypertension: Secondary | ICD-10-CM

## 2024-02-10 DIAGNOSIS — Z131 Encounter for screening for diabetes mellitus: Secondary | ICD-10-CM | POA: Diagnosis not present

## 2024-02-10 DIAGNOSIS — Z720 Tobacco use: Secondary | ICD-10-CM

## 2024-02-10 DIAGNOSIS — Z0000 Encounter for general adult medical examination without abnormal findings: Secondary | ICD-10-CM

## 2024-02-10 DIAGNOSIS — Z130 Encounter for screening for diseases of the blood and blood-forming organs and certain disorders involving the immune mechanism: Secondary | ICD-10-CM

## 2024-02-10 DIAGNOSIS — R41840 Attention and concentration deficit: Secondary | ICD-10-CM

## 2024-02-10 DIAGNOSIS — Z125 Encounter for screening for malignant neoplasm of prostate: Secondary | ICD-10-CM

## 2024-02-10 DIAGNOSIS — M1A0790 Idiopathic chronic gout, unspecified ankle and foot, without tophus (tophi): Secondary | ICD-10-CM

## 2024-02-10 DIAGNOSIS — Z23 Encounter for immunization: Secondary | ICD-10-CM

## 2024-02-10 DIAGNOSIS — E785 Hyperlipidemia, unspecified: Secondary | ICD-10-CM

## 2024-02-10 LAB — CBC
HCT: 44.9 % (ref 39.0–52.0)
Hemoglobin: 15.3 g/dL (ref 13.0–17.0)
MCHC: 34.2 g/dL (ref 30.0–36.0)
MCV: 95.8 fl (ref 78.0–100.0)
Platelets: 236 K/uL (ref 150.0–400.0)
RBC: 4.69 Mil/uL (ref 4.22–5.81)
RDW: 13 % (ref 11.5–15.5)
WBC: 5.3 K/uL (ref 4.0–10.5)

## 2024-02-10 LAB — LIPID PANEL
Cholesterol: 181 mg/dL (ref 0–200)
HDL: 43.7 mg/dL (ref >39.00)
LDL Cholesterol: 109 mg/dL — ABNORMAL HIGH (ref 0–99)
NonHDL: 137.69
Total CHOL/HDL Ratio: 4
Triglycerides: 142 mg/dL (ref 0.0–149.0)
VLDL: 28.4 mg/dL (ref 0.0–40.0)

## 2024-02-10 LAB — COMPREHENSIVE METABOLIC PANEL WITH GFR
ALT: 22 U/L (ref 0–53)
AST: 17 U/L (ref 0–37)
Albumin: 4.5 g/dL (ref 3.5–5.2)
Alkaline Phosphatase: 70 U/L (ref 39–117)
BUN: 17 mg/dL (ref 6–23)
CO2: 28 meq/L (ref 19–32)
Calcium: 9.3 mg/dL (ref 8.4–10.5)
Chloride: 104 meq/L (ref 96–112)
Creatinine, Ser: 0.95 mg/dL (ref 0.40–1.50)
GFR: 93.49 mL/min (ref >60.00)
Glucose, Bld: 91 mg/dL (ref 70–99)
Potassium: 4.4 meq/L (ref 3.5–5.1)
Sodium: 141 meq/L (ref 135–145)
Total Bilirubin: 0.8 mg/dL (ref 0.2–1.2)
Total Protein: 6.6 g/dL (ref 6.0–8.3)

## 2024-02-10 LAB — PSA: PSA: 1.08 ng/mL (ref 0.10–4.00)

## 2024-02-10 LAB — HEMOGLOBIN A1C: Hgb A1c MFr Bld: 5.4 % (ref 4.6–6.5)

## 2024-02-10 MED ORDER — AMPHETAMINE-DEXTROAMPHET ER 10 MG PO CP24: ORAL_CAPSULE | ORAL | 0 refills | Status: AC

## 2024-02-10 MED ORDER — ALLOPURINOL 300 MG PO TABS: 300.0000 mg | ORAL_TABLET | Freq: Every day | ORAL | 3 refills | Status: AC

## 2024-04-04 ENCOUNTER — Ambulatory Visit (HOSPITAL_BASED_OUTPATIENT_CLINIC_OR_DEPARTMENT_OTHER)
Admission: RE | Admit: 2024-04-04 | Discharge: 2024-04-04 | Disposition: A | Discharge status: Home / Self Care | Payer: Self-pay | Source: Ambulatory Visit | Attending: Family Medicine | Admitting: Family Medicine

## 2024-04-04 ENCOUNTER — Ambulatory Visit (HOSPITAL_BASED_OUTPATIENT_CLINIC_OR_DEPARTMENT_OTHER)

## 2024-04-04 DIAGNOSIS — E785 Hyperlipidemia, unspecified: Secondary | ICD-10-CM | POA: Insufficient documentation

## 2024-04-05 ENCOUNTER — Encounter: Payer: Self-pay | Admitting: Family Medicine

## 2025-02-13 ENCOUNTER — Encounter: Admitting: Family Medicine
